# Patient Record
Sex: Male | Born: 1940 | Race: White | Hispanic: No | Marital: Married | Smoking: Former smoker
Health system: Southern US, Community
[De-identification: ages and names within clinical notes are randomized; demographics above are authoritative.]

## PROBLEM LIST (undated history)

## (undated) DIAGNOSIS — H9190 Unspecified hearing loss, unspecified ear: Secondary | ICD-10-CM

## (undated) DIAGNOSIS — R413 Other amnesia: Secondary | ICD-10-CM

## (undated) DIAGNOSIS — J449 Chronic obstructive pulmonary disease, unspecified: Secondary | ICD-10-CM

## (undated) DIAGNOSIS — I10 Essential (primary) hypertension: Secondary | ICD-10-CM

## (undated) DIAGNOSIS — C801 Malignant (primary) neoplasm, unspecified: Secondary | ICD-10-CM

## (undated) HISTORY — PX: HERNIA REPAIR: SHX51

## (undated) HISTORY — DX: Chronic obstructive pulmonary disease, unspecified: J44.9

## (undated) HISTORY — DX: Malignant (primary) neoplasm, unspecified: C80.1

## (undated) HISTORY — DX: Other amnesia: R41.3

## (undated) HISTORY — DX: Essential (primary) hypertension: I10

## (undated) HISTORY — PX: PROSTATE SURGERY: SHX751

## (undated) HISTORY — DX: Unspecified hearing loss, unspecified ear: H91.90

---

## 1999-02-02 ENCOUNTER — Ambulatory Visit (HOSPITAL_COMMUNITY): Admission: RE | Admit: 1999-02-02 | Discharge: 1999-02-02 | Payer: Self-pay | Admitting: *Deleted

## 2001-01-24 ENCOUNTER — Encounter (INDEPENDENT_AMBULATORY_CARE_PROVIDER_SITE_OTHER): Payer: Self-pay | Admitting: Specialist

## 2001-01-24 ENCOUNTER — Other Ambulatory Visit: Admission: RE | Admit: 2001-01-24 | Discharge: 2001-01-24 | Payer: Self-pay | Admitting: Urology

## 2001-03-26 ENCOUNTER — Encounter (INDEPENDENT_AMBULATORY_CARE_PROVIDER_SITE_OTHER): Payer: Self-pay

## 2001-03-26 ENCOUNTER — Inpatient Hospital Stay (HOSPITAL_COMMUNITY): Admission: RE | Admit: 2001-03-26 | Discharge: 2001-03-29 | Payer: Self-pay | Admitting: Urology

## 2002-04-12 ENCOUNTER — Encounter (INDEPENDENT_AMBULATORY_CARE_PROVIDER_SITE_OTHER): Payer: Self-pay | Admitting: Specialist

## 2002-04-12 ENCOUNTER — Ambulatory Visit (HOSPITAL_COMMUNITY): Admission: RE | Admit: 2002-04-12 | Discharge: 2002-04-12 | Payer: Self-pay | Admitting: *Deleted

## 2010-11-30 ENCOUNTER — Encounter
Admission: RE | Admit: 2010-11-30 | Discharge: 2010-11-30 | Payer: Self-pay | Source: Home / Self Care | Attending: Family Medicine | Admitting: Family Medicine

## 2011-03-25 NOTE — Op Note (Signed)
[facility]  Patient:    CASHTON, HOSLEY                  MRN: [id number] Proc. Date: 03/26/01 Adm. Date:  [id number] Attending:  Lauree Chandler CC:         Tammy R. Collins Scotland, M.D.   Operative Report  PREOPERATIVE DIAGNOSIS:  Prostatic carcinoma.  POSTOPERATIVE DIAGNOSIS:  Prostatic carcinoma.  PROCEDURE:  Radical retropubic prostatectomy and bilateral pelvic lymph node dissection.  SURGEON:  Maretta Bees. Vonita Moss, M.D.  ASSISTANT:  Verl Dicker, M.D.  ANESTHESIA:  General.  INDICATIONS:  This 70 year old gentleman was found to have a PSA of 5.8, and then a biopsy of the prostate showed a Gleason grade 6 carcinoma, and he opted for radical retropubic prostatectomy and was advised about the surgical risks of impotence, incontinence, rectal injury, or hemorrhage.  DESCRIPTION OF PROCEDURE:  The patient was brought to the operating room and placed in the lithotomy position.  The lower abdomen and external genitalia were prepped and draped in the usual fashion.  A lower midline incision was made in the rectus fascia and divided in the midline, and the bilateral pelvic lymph node spaces were dissected out.  The right obturator lymph node packet was dissected out with care to protect the iliac vessels and obturator nerve, and metal hemoclips and dark silk ties were utilized for hemostasis and lymphostasis.  In a like fashion, the left obturator lymph node packet was dissected out.  The endopelvic fascia was then taken down bilaterally.  The puboprostatic ligaments were divided.  Superficial dorsal vein was divided and ligated with 3-0 chromic catgut.  A Hohenfeltner clamp was placed around the dorsal vein complex and a heavy Vicryl tie placed around it.  Subsequently, the tie was a little too close to the apex of the prostate, so I cut through that and then secured the dorsal vein complex with figure-of-eight sutures of #2-0 Prolene.  The  anterior aspect of the urethra was divided just beyond the apex of the prostate and the Foley catheter brought up in the wound, and the posterior aspect of the prostate then divided.  The prostate came up nicely off the rectum.  The prostatic pedicles were controlled with metal hemoclips. The posterior aspect of the seminal vesicles were easily dissected out.  The anterior bladder neck was opened.  The ureteral orifices were noted to excrete indigo carmine stained urine.  The posterior bladder neck was divided in the plane between the posterior bladder neck and the prostate and seminal vesicles identified and dissected out.  The seminal vesicles and vas deferens were then divided and ligated with metal hemoclips.  The specimen was removed intact and had very good apical margin.  The bladder neck mucosa was approximated to the surrounding bladder neck with interrupted 4-0 chromic catgut.  The posterior bladder neck was closed in a tennis racquet fashion using running #2-0 chromic catgut.  Subsequently, the anastomosis between the bladder neck and urethra was completed with 2-0 Vicryl placed at 2, 5, 7, 10, and 12 oclock.  The bladder was drained with a 22 French 5 cc Foley and had #1 Prolene placed through the tip of the Foley catheter and brought through the anterior bladder wall and subsequently through the abdominal wound and sutured at the skin level to the button.  With 15 cc of water in the balloon, the anastomosis between the bladder neck and urethra were tied down and were watertight with irrigation and  antibiotic solution.  Stab wound to the left ______ incision was made and placed a large Blake drain for perivesical wound drainage.  The drain was held in place with a black silk and connected to bulb suction.  The wound was irrigated with triple antibiotic solution and the rectus fascia closed with a running #1 PDS.  The skin was closed with skin staples.  The wound was clean and  dressed with dry sterile gauze dressings.  Sponge, needle and instrument counts were correct.  The patient tolerated the procedure well and was taken to the recovery room in good condition. DD:  03/26/01 TD:  03/26/01 Job: [id number]

## 2011-03-25 NOTE — Discharge Summary (Signed)
[facility]  Patient:    Todd Cain, Todd Cain                  MRN: [id number] Adm. Date:  [id number] Disc. Date: [id number] Attending:  Lauree Chandler CC:         Tammy R. Collins Scotland, M.D.                           Discharge Summary  FINAL DIAGNOSES: 1. Prostatic carcinoma. 2. Hypertension. 3. Gastroesophageal reflux disease. 4. Emphysema. 5. Environmental allergies.  PROCEDURES:  Radical retropubic prostatectomy and bilateral pelvic lymph node dissection.  HISTORY OF PRESENT ILLNESS:  This 70 year old white male was referred to me with a PSA of 5.8.  It had elevated from a value of 1.6 a year ago.  Biopsy revealed a Gleason grade 6 carcinoma.  He was counseled about therapy and elected surgical removal.  Physical exam was noncontributory.  HOSPITAL COURSE:  After admission, he underwent a radical retropubic prostatectomy and bilateral pelvic lymph node dissection.  Postoperatively, he did quite nicely, had no problems with fever.  He had no excessive Blake drain output.  He ambulated well.  He started passing flatus and was started on a diet on Mar 28, 2001.  His Blake drain was removed on the day of discharge. His final pathology showed carcinoma confined to the prostate.  DISCHARGE MEDICATIONS:  He was sent home on his usual medications: 1. Toprol XL 100 mg q.d. 2. Doxazosin 1 mg q.d. 3. Prilosec 20 mg q.d. 4. Flonase sprays p.r.n. and his other pulmonary inhalers.  DISCHARGE INSTRUCTIONS:  He will go home on light activity and diet as tolerated.  He will go home with his Foley-connected leg bag.  He may shower tomorrow.  DISCHARGE FOLLOWUP:  He will return to the office next week for skin staple removal.  CONDITION AT DISCHARGE:  He was sent home in good condition. DD:  03/29/01 TD:  03/29/01 Job: [id number]

## 2011-03-25 NOTE — H&P (Signed)
[facility]  Patient:    Todd Cain, Todd Cain                  MRN: [id number] Adm. Date:  [id number] Attending:  Lauree Chandler CC:         Tammy R. Collins Scotland, M.D.   History and Physical  HISTORY:  This 70 year old white male was referred to me in March of this year because he had a PSA of 5.8.  A year ago, his PSA was 1.6.  He had some mild voiding symptoms.  He underwent a prostate ultrasound and biopsy which showed a 35 cc prostate and he had Gleason grade 6 carcinoma in the right lobe.  He was counseled about therapeutic measures and he elected for radical retropubic prostatectomy and bilateral pelvic lymph node dissection and he was cleared medically by Dr. Bayard Beaver. Spear.  PAST MEDICAL HISTORY:  His medical problems include well-controlled hypertension, some GERD and also some emphysema and nasal allergies.  MEDICATIONS: 1. Toprol-XL 100 mg a day. 2. Doxazosin 1 mg per day. 3. Prilosec 20 mg daily. 4. Flonase sprays p.r.n. 5. Advair Diskus b.i.d.  ALLERGIES:  None to medications.  PAST SURGICAL HISTORY:  Previous surgeries include an umbilical hernia repair and sinus surgery.  SOCIAL HISTORY:  He does not smoke or drink alcohol.  FAMILY HISTORY:  Noncontributory.  REVIEW OF SYSTEMS:  Review of systems is signed off on the health history form.  PHYSICAL EXAMINATION:  VITAL SIGNS:  As recorded.  GENERAL:  He is alert and oriented.  SKIN:  Warm and dry.  NECK:  Supple.  CHEST:  Increased AP diameter of the chest but clear.  HEART:  Heart tones regular.  ABDOMEN:  Soft and nontender.  Umbilical hernia incisional scar.  GU:  External genitalia unremarkable.  RECTAL:  Prostate feels benign.  IMPRESSION: 1. Prostatic carcinoma. 2. Hypertension. 3. Gastroesophageal reflux disease. 4. Emphysema. 5. Environmental allergies.  PLAN:  Radical retropubic prostatectomy and bilateral pelvic lymph node dissection. DD:   03/26/01 TD:  03/26/01 Job: [id number]

## 2011-05-10 ENCOUNTER — Ambulatory Visit (HOSPITAL_BASED_OUTPATIENT_CLINIC_OR_DEPARTMENT_OTHER)
Admission: RE | Admit: 2011-05-10 | Discharge: 2011-05-10 | Disposition: A | Discharge status: Home / Self Care | Payer: Federal, State, Local not specified - PPO | Source: Ambulatory Visit | Attending: Urology | Admitting: Urology

## 2011-05-10 DIAGNOSIS — Z9079 Acquired absence of other genital organ(s): Secondary | ICD-10-CM | POA: Insufficient documentation

## 2011-05-10 DIAGNOSIS — Z01812 Encounter for preprocedural laboratory examination: Secondary | ICD-10-CM | POA: Insufficient documentation

## 2011-05-10 DIAGNOSIS — K219 Gastro-esophageal reflux disease without esophagitis: Secondary | ICD-10-CM | POA: Insufficient documentation

## 2011-05-10 DIAGNOSIS — N3946 Mixed incontinence: Secondary | ICD-10-CM | POA: Insufficient documentation

## 2011-05-10 DIAGNOSIS — Z79899 Other long term (current) drug therapy: Secondary | ICD-10-CM | POA: Insufficient documentation

## 2011-05-10 DIAGNOSIS — N320 Bladder-neck obstruction: Secondary | ICD-10-CM | POA: Insufficient documentation

## 2011-05-10 DIAGNOSIS — I10 Essential (primary) hypertension: Secondary | ICD-10-CM | POA: Insufficient documentation

## 2011-05-10 LAB — POCT I-STAT, CHEM 8
Creatinine, Ser: 1.4 mg/dL — ABNORMAL HIGH (ref 0.50–1.35)
Glucose, Bld: 118 mg/dL — ABNORMAL HIGH (ref 70–99)
Hemoglobin: 16.3 g/dL (ref 13.0–17.0)
TCO2: 22 mmol/L (ref 0–100)

## 2011-05-24 NOTE — Op Note (Signed)
  NAMELevaughn, Todd Cain NO.:  [id number]  MEDICAL RECORD NO.:  [id number]  LOCATION:                                 FACILITY:  PHYSICIAN:  Martina Sinner, MD      DATE OF BIRTH:  DATE OF PROCEDURE:  05/10/2011 DATE OF DISCHARGE:                              OPERATIVE REPORT   PREOPERATIVE DIAGNOSIS:  Bladder neck contracture.  POSTOPERATIVE DIAGNOSIS:  Bladder neck contracture.  SURGERY:  Cystoscopy, balloon dilation.  The patient has mixed incontinence and the above diagnosis.  He consented to balloon dilation.  The patient was prepped and draped in usual fashion.  A 21-French scope was utilized.  Penile bulbar urethra were normal.  A 6-8 French bladder neck contracture looked short.  Under cystoscopic and fluoroscopic guidance, I passed a sensor wire through the bladder neck contracture curling in the bladder.  Under fluoroscopic guidance, I placed the balloon dilation catheter well lubricated with a marker just into the bladder.  I balloon dilated for approximately 3 to 4 minutes at 18 atmospheres of pressure.  There was no wasting of the balloon.  I deflated the balloon dilation catheter and removed it.  I scoped along the wire with a 17-French scope.  The bladder neck was wide open approximately 18 to 19 [place].  Bladder mucosa and trigone were normal. No stitch, foreign body or carcinoma.  There was no bleeding.  Catheter was not inserted.  Bladder was emptied.  The patient was taken to recovery room.  The patient now will undergo urodynamics and will proceed accordingly.          ______________________________ Martina Sinner, MD     SAM/MEDQ  D:  05/10/2011  T:  05/10/2011  Job:  [id number]  Electronically Signed by Alfredo Martinez MD on 05/24/2011 08:14:00 AM

## 2011-11-30 ENCOUNTER — Encounter: Payer: Federal, State, Local not specified - PPO | Attending: Family Medicine | Admitting: *Deleted

## 2011-11-30 ENCOUNTER — Encounter: Payer: Self-pay | Admitting: *Deleted

## 2011-11-30 DIAGNOSIS — Z713 Dietary counseling and surveillance: Secondary | ICD-10-CM | POA: Insufficient documentation

## 2011-11-30 DIAGNOSIS — E119 Type 2 diabetes mellitus without complications: Secondary | ICD-10-CM | POA: Insufficient documentation

## 2011-11-30 NOTE — Progress Notes (Signed)
  Medical Nutrition Therapy:  Appt start time: 1100 end time:  1200.   Assessment:  Primary concerns today: Patient here with his wife who appears supportive. They are both retired and live fairly quiet lives. She has studied nutrition and been taught carb counting in the past.   MEDICATIONS: see list. No diabetes medications at this time   DIETARY INTAKE:  Usual eating pattern includes 3 meals and 1-2 snacks per day.  Everyday foods include good variety of all food groups.  Avoided foods include fried or fatty foods, regular soda.    24-hr recall:  B ( AM): large bowl sweetened cereal, skim milk, coffee w/ Equal and powdered skim milk Snk ( AM): none  L ( PM): skip occasionally OR Nutrigrain bar etc OR sandwich - ham & cheese on whole grain, occasionally chips with flaxseed, iced tea w/ Splenda Snk ( PM): Nutri grain bar or pudding D ( PM): lean meat, starch, (sweet potato) vegetables, occasional salad, OR Healthy Choice dinner, iced tea Snk ( PM): pudding, jello, special cake if made, ice cream Beverages: coffee, iced tea with Splenda,   Usual physical activity: limited, sits at computer a lot  Estimated energy needs: 2000 calories 225 g carbohydrates 150 g protein 56 g fat  Progress Towards Goal(s):  In progress.   Nutritional Diagnosis:  NI-5.8.4 Inconsistent carbohydrate intake As related to pre-diabetes.  As evidenced by elevated BG during GTT.    Intervention:  Nutrition counseling and diabetes information provided. Discussed basic physiology of diabetes in relation to pre-diabetes. Also explained the advantage of a consistent carb intake, taught carb counting and how to read food labels.  Plan: Aim for 4 Carb choices (60 grams) per meal and 2 per snack if hungry Read Food Labels for Total Carbohydrate of foods Continue with current lunch and supper choices as they are moderate in carb content Consider either smaller portion of sweetened cereal or normal portion of  unsweetened cereal for breakfast  Handouts given during visit include:  Living Well with Diabetes  Carb Counting and Food Label Handout  Monitoring/Evaluation:  Dietary intake, exercise, Food Labels, and body weight in 2 week(s).

## 2011-11-30 NOTE — Patient Instructions (Signed)
Plan: Aim for 4 Carb choices (60 grams) per meal and 2 per snack if hungry Read Food Labels for Total Carbohydrate of foods Continue with current lunch and supper choices as they are moderate in carb content Consider either smaller portion of sweetened cereal or normal portion of unsweetened cereal for breakfast

## 2011-12-26 ENCOUNTER — Encounter: Payer: Self-pay | Admitting: *Deleted

## 2011-12-26 ENCOUNTER — Encounter: Payer: Federal, State, Local not specified - PPO | Attending: Family Medicine | Admitting: *Deleted

## 2011-12-26 DIAGNOSIS — E119 Type 2 diabetes mellitus without complications: Secondary | ICD-10-CM | POA: Insufficient documentation

## 2011-12-26 DIAGNOSIS — Z713 Dietary counseling and surveillance: Secondary | ICD-10-CM | POA: Insufficient documentation

## 2011-12-26 NOTE — Patient Instructions (Signed)
Plan: Start Lower Sodium Diet to help kidney function improve Continue with Diabetes Meal Plan with 2-3 Carb choices per meal for continued weight loss Continue with exercising 10 minutes per day and increase as tolerated.  Walking or stationary bike.

## 2011-12-26 NOTE — Progress Notes (Signed)
  Medical Nutrition Therapy:  Appt start time: 1100 end time:  1200.   Assessment:  Primary concerns today: Patient here by himself for follow up visit. He has lost 4 pounds in the past 4 weeks. He states he recently saw a specialist for his kidneys and has been cautioned that he needs to lose weight and control his blood pressure better. He is very motivated to protect his kidneys as soon as possible.  MEDICATIONS: see list. No diabetes medications at this time   DIETARY INTAKE:  Usual eating pattern includes 3 meals and 1-2 snacks per day.  Everyday foods include good variety of all food groups.  Avoided foods include fried or fatty foods, regular soda.    24-hr recall:  B ( AM): large bowl un-sweetened cereal, skim milk, coffee w/ Equal and powdered skim milk Snk ( AM): none  L ( PM): skip occasionally OR Nutrigrain bar etc OR sandwich - ham & cheese on whole grain, occasionally chips with flaxseed, iced tea w/ Splenda Snk ( PM): Nutri grain bar or pudding D ( PM): lean meat, starch, (sweet potato) vegetables, occasional salad, OR Healthy Choice dinner, iced tea Snk ( PM): pudding, jello, special cake if made, ice cream Beverages: coffee, iced tea with Splenda,   Usual physical activity:has increased to walking about 10 minutes daily and plans to start riding the stationary bike he has at home. Estimated energy needs: 1600 calories 180 g carbohydrates 120 g protein 44 g fat  Progress Towards Goal(s):  In progress.   Nutritional Diagnosis:  NI-5.8.4 Inconsistent carbohydrate intake As related to pre-diabetes.  As evidenced by elevated BG during GTT.    Intervention: Spent most of this visit discussing ways to lower his sodium intake. Reviewed foods he typically eats in terms of Carb and Sodium content. He has adjusted his breakfast meal appropriately to unsweetened cereals and continues to eat appropriately for lunch and supper.  Plan: Start Lower Sodium Diet to help kidney  function improve Continue with Diabetes Meal Plan with 2-3 Carb choices per meal for continued weight loss Continue with exercising 10 minutes per day and increase as tolerated.  Walking or stationary bike.  Handouts given during visit include:  Sodium content of foods by ADA  Monitoring/Evaluation:  Dietary intake, exercise, Food Labels, and body weight in 2 months.

## 2013-01-28 ENCOUNTER — Emergency Department (HOSPITAL_COMMUNITY)
Admission: EM | Admit: 2013-01-28 | Discharge: 2013-01-28 | Disposition: A | Discharge status: Home / Self Care | Payer: Federal, State, Local not specified - PPO | Attending: Emergency Medicine | Admitting: Emergency Medicine

## 2013-01-28 ENCOUNTER — Emergency Department (HOSPITAL_COMMUNITY): Payer: Federal, State, Local not specified - PPO

## 2013-01-28 ENCOUNTER — Encounter (HOSPITAL_COMMUNITY): Payer: Self-pay | Admitting: Emergency Medicine

## 2013-01-28 DIAGNOSIS — J4489 Other specified chronic obstructive pulmonary disease: Secondary | ICD-10-CM | POA: Insufficient documentation

## 2013-01-28 DIAGNOSIS — R197 Diarrhea, unspecified: Secondary | ICD-10-CM | POA: Insufficient documentation

## 2013-01-28 DIAGNOSIS — I10 Essential (primary) hypertension: Secondary | ICD-10-CM | POA: Insufficient documentation

## 2013-01-28 DIAGNOSIS — M545 Low back pain: Secondary | ICD-10-CM

## 2013-01-28 DIAGNOSIS — K644 Residual hemorrhoidal skin tags: Secondary | ICD-10-CM | POA: Insufficient documentation

## 2013-01-28 DIAGNOSIS — Z859 Personal history of malignant neoplasm, unspecified: Secondary | ICD-10-CM | POA: Insufficient documentation

## 2013-01-28 DIAGNOSIS — E669 Obesity, unspecified: Secondary | ICD-10-CM | POA: Insufficient documentation

## 2013-01-28 DIAGNOSIS — Z79899 Other long term (current) drug therapy: Secondary | ICD-10-CM | POA: Insufficient documentation

## 2013-01-28 DIAGNOSIS — K921 Melena: Secondary | ICD-10-CM | POA: Insufficient documentation

## 2013-01-28 DIAGNOSIS — J449 Chronic obstructive pulmonary disease, unspecified: Secondary | ICD-10-CM | POA: Insufficient documentation

## 2013-01-28 DIAGNOSIS — Z7982 Long term (current) use of aspirin: Secondary | ICD-10-CM | POA: Insufficient documentation

## 2013-01-28 LAB — URINALYSIS, ROUTINE W REFLEX MICROSCOPIC
Bilirubin Urine: NEGATIVE
Hgb urine dipstick: NEGATIVE
Protein, ur: NEGATIVE mg/dL
Specific Gravity, Urine: 1.013 (ref 1.005–1.030)
Urobilinogen, UA: 0.2 mg/dL (ref 0.0–1.0)

## 2013-01-28 LAB — CBC WITH DIFFERENTIAL/PLATELET
Eosinophils Absolute: 0.6 10*3/uL (ref 0.0–0.7)
Eosinophils Relative: 5 % (ref 0–5)
Lymphs Abs: 3.3 10*3/uL (ref 0.7–4.0)
MCH: 28.7 pg (ref 26.0–34.0)
MCV: 83.5 fL (ref 78.0–100.0)
Monocytes Absolute: 1 10*3/uL (ref 0.1–1.0)
Platelets: 283 10*3/uL (ref 150–400)
RBC: 5.27 MIL/uL (ref 4.22–5.81)

## 2013-01-28 LAB — BASIC METABOLIC PANEL
BUN: 17 mg/dL (ref 6–23)
Calcium: 9.8 mg/dL (ref 8.4–10.5)
Creatinine, Ser: 1.62 mg/dL — ABNORMAL HIGH (ref 0.50–1.35)
GFR calc non Af Amer: 40 mL/min — ABNORMAL LOW (ref >90)
Glucose, Bld: 93 mg/dL (ref 70–99)
Sodium: 138 mEq/L (ref 135–145)

## 2013-01-28 MED ORDER — ONDANSETRON 4 MG PO TBDP
8.0000 mg | ORAL_TABLET | Freq: Once | ORAL | Status: AC
  Administered 2013-01-28: 8 mg via ORAL
  Filled 2013-01-28: qty 1

## 2013-01-28 MED ORDER — CYCLOBENZAPRINE HCL 10 MG PO TABS: 10.0000 mg | ORAL_TABLET | Freq: Two times a day (BID) | ORAL | Status: DC | PRN

## 2013-01-28 MED ORDER — OXYCODONE-ACETAMINOPHEN 5-325 MG PO TABS
2.0000 | ORAL_TABLET | Freq: Once | ORAL | Status: AC
  Administered 2013-01-28: 2 via ORAL
  Filled 2013-01-28: qty 2

## 2013-01-28 MED ORDER — OXYCODONE-ACETAMINOPHEN 5-325 MG PO TABS: 1.0000 | ORAL_TABLET | ORAL | Status: DC | PRN

## 2013-01-28 NOTE — ED Notes (Signed)
Wife states pt falls asleep in computer chair

## 2013-01-28 NOTE — ED Provider Notes (Signed)
History     CSN: [id number]  Arrival date & time 01/28/13  1449   First MD Initiated Contact with Patient 01/28/13 1817      Chief Complaint  Patient presents with  . Back Pain  . GI Bleeding    (Consider location/radiation/quality/duration/timing/severity/associated sxs/prior treatment) HPI Comments: Patient is a 72 year old male with a past medical history of COPD and hypertension who presents for left lower back pain x 2.5 weeks. Patient states the pain is intermittently sharp in sensation and nonradiating. Patient states the pain is worse with movement and walking and mildly improved when lying on his back or right side. Patient has tried heat packs on his back with mild relief and ibuprofen without relief. Patient denies fever, vision changes, chest pain, shortness of breath, numbness or tingling in his extremities, weakness, saddle anesthesia, and bowel or bladder incontinence. Patient presents to emergency Department as he was having a bowel movement yesterday and noticed bright red blood on the toilet tissue after lying and himself. Patient called his primary care provider, Herb Grays, who advised him to be seen in the emergency department given his left lower back pain and bright red blood per rectum. Patient states the bowel movement was watery in consistency and he admits to noticing a small amount of blood in the toilet bowl not mixed with his stool.the patient has not had a bowel movement today but denies bleeding from his rectum since this episode. He further denies nausea, vomiting, abdominal pain, pain with defecation or rectal pain, hematuria, and dysuria. Patient has a history of TURP in 2002. Patient admits to being followed by a nephrologist, Dr. Kathrene Bongo. Last colonoscopy was 5 years ago.  The history is provided by the patient. No language interpreter was used.    Past Medical History  Diagnosis Date  . Cancer   . COPD (chronic obstructive pulmonary disease)   .  Hypertension     Past Surgical History  Procedure Laterality Date  . Hernia repair    . Prostate surgery      No family history on file.  History  Substance Use Topics  . Smoking status: Former Smoker    Quit date: 11/29/2010  . Smokeless tobacco: Never Used  . Alcohol Use: No      Review of Systems  Constitutional: Negative for fever and chills.  Eyes: Negative for visual disturbance.  Respiratory: Negative for shortness of breath.   Cardiovascular: Negative for chest pain and leg swelling.  Gastrointestinal: Positive for blood in stool. Negative for nausea, vomiting, abdominal pain and diarrhea.  Genitourinary: Negative for dysuria and hematuria.  Musculoskeletal: Positive for back pain.  Neurological: Negative for syncope, weakness and numbness.  All other systems reviewed and are negative.    Allergies  Review of patient's allergies indicates no known allergies.  Home Medications   Current Outpatient Rx  Name  Route  Sig  Dispense  Refill  . amLODipine-benazepril (LOTREL) 5-10 MG per capsule   Oral   Take 1 capsule by mouth daily.         Marland Kitchen aspirin 81 MG tablet   Oral   Take 81 mg by mouth daily.          Marland Kitchen buPROPion (WELLBUTRIN XL) 300 MG 24 hr tablet   Oral   Take 300 mg by mouth daily.         . carvedilol (COREG) 12.5 MG tablet   Oral   Take 12.5 mg by mouth 2 (two) times daily  with a meal.         . citalopram (CELEXA) 20 MG tablet   Oral   Take 20 mg by mouth daily.         . fish oil-omega-3 fatty acids 1000 MG capsule   Oral   Take 2 g by mouth daily.         Marland Kitchen ibuprofen (ADVIL,MOTRIN) 200 MG tablet   Oral   Take 400 mg by mouth every 6 (six) hours as needed for pain.         Marland Kitchen omeprazole (PRILOSEC) 20 MG capsule   Oral   Take 20 mg by mouth daily.         . pravastatin (PRAVACHOL) 40 MG tablet   Oral   Take 40 mg by mouth daily.         . cyclobenzaprine (FLEXERIL) 10 MG tablet   Oral   Take 1 tablet (10 mg  total) by mouth 2 (two) times daily as needed for muscle spasms.   10 tablet   0   . oxyCODONE-acetaminophen (PERCOCET/ROXICET) 5-325 MG per tablet   Oral   Take 1 tablet by mouth every 4 (four) hours as needed for pain.   10 tablet   0     BP 136/80  Pulse 92  Temp(Src) 98.7 F (37.1 C) (Rectal)  Resp 16  SpO2 97%  Physical Exam  Nursing note and vitals reviewed. Constitutional: He is oriented to person, place, and time. No distress.  Obese male who is resting comfortably, well and nontoxic appearing, and in no acute distress.  HENT:  Head: Normocephalic and atraumatic.  Mouth/Throat: Oropharynx is clear and moist. No oropharyngeal exudate.  Eyes: Conjunctivae are normal. Pupils are equal, round, and reactive to light. No scleral icterus.  Neck: Normal range of motion. Neck supple.  Cardiovascular: Normal rate, regular rhythm, normal heart sounds and intact distal pulses.   Distal radial, dorsalis pedis, and posterior tibial pulses 2+ bilaterally.  Pulmonary/Chest: Effort normal and breath sounds normal. No respiratory distress. He has no wheezes. He has no rales.  Abdominal: Soft. Bowel sounds are normal. He exhibits no distension. There is no tenderness. There is no rebound and no guarding.  Genitourinary: Rectal exam shows external hemorrhoid. Rectal exam shows no internal hemorrhoid, no fissure, no tenderness and anal tone normal.  Patient exhibits normal rectal tone on rectal exam. Exam significant for a small external hemorrhoid that is nonthrombosed. No internal hemorrhoids or anal fissures appreciated. Stool is brown in color with no evidence of blood.  Musculoskeletal:       Lumbar back: He exhibits tenderness. He exhibits no bony tenderness, no swelling, no deformity, no laceration and no spasm.       Back:  Patient has tenderness on palpation of his left lumbar paraspinal muscles. No tenderness on palpation of his lumbar vertebrae. No step-offs or bony deformities  appreciated. No ecchymosis or abrasions. Patient has negative straight leg raising and cross straight leg raise. Deep tendon reflexes normal and symmetric in the patient's b/l lower extremities. Normal range of motion of patient's bilateral hips, knees, and ankles with no tenderness on palpation. No sensory or motor deficits appreciated.  Lymphadenopathy:    He has no cervical adenopathy.  Neurological: He is alert and oriented to person, place, and time.  Skin: Skin is warm and dry. He is not diaphoretic.  Psychiatric: He has a normal mood and affect. His behavior is normal.    ED Course  Procedures (  including critical care time)  Labs Reviewed  CBC WITH DIFFERENTIAL - Abnormal; Notable for the following:    WBC 11.2 (*)    All other components within normal limits  BASIC METABOLIC PANEL - Abnormal; Notable for the following:    Creatinine, Ser 1.62 (*)    GFR calc non Af Amer 40 (*)    GFR calc Af Amer 47 (*)    All other components within normal limits  OCCULT BLOOD, POC DEVICE - Abnormal; Notable for the following:    Fecal Occult Bld POSITIVE (*)    All other components within normal limits  URINALYSIS, ROUTINE W REFLEX MICROSCOPIC  OCCULT BLOOD X 1 CARD TO LAB, STOOL   Dg Lumbar Spine Complete  01/28/2013  *RADIOLOGY REPORT*  Clinical Data: Low back pain.  Left-sided back pain for 2 weeks.  LUMBAR SPINE - COMPLETE 4+ VIEW  Comparison: None.  Findings: There is a dextroconvex lumbar curvature with the apex at L3.  Asymmetric left-sided disc space collapse is present at L3-L4. Lower lumbar facet arthrosis.  There is complete collapse of the disc L4-L5 with apparent ankylosis across the disc space.  Lower lumbar endplate spurring is present encroaching on the neural foramina.  There is no fracture or acute osseous abnormality. Vertebral body height is preserved.  Aortoiliac atherosclerosis. Surgical clips in the anatomic pelvis.  The visualized pelvic rings appear intact.  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  No acute osseous abnormality.   Original Report Authenticated By: Andreas Newport, M.D.     1. Blood in stool   2. Low back pain     MDM  Patient presents for left sided low back pain x 2-1/2 weeks as well as one instance of bright red blood per rectum yesterday upon wiping himself after an episode watery diarrhea. On exam the patient exhibits tenderness on palpation of his left lumbar paraspinal muscles with a negative straight leg raising cross straight leg raise. Given patient's episode of rectal bleeding workup in the emergency department with basic labs, urinalysis, and hemoccult.   Patient's labs significant for positive hemoccult and creatinine elevated to 1.62. Xray of the lumbar spine also significant for moderate to severe chronic lumbar spondylosis without any evidence of acute fracture, dislocation, or bony abnormality. Patient's Hgb/Hct stable, c/w prior work ups, and UA unremarkable; he is well and nontoxic appearing and in NAD. Based on results and physical exam believe patient's complaints are unrelated. Will d/c with orthopedic and PCP follow up with both flexeril and percocet to take as needed for his back pain. Have advised the patient to also f/u with his nephrologist regarding his kidney function. Patient states comfort and understanding with this d/c plan with no unaddressed concerns. Patient work up and management discussed with Dr. Rubin Payor who is in agreement.  Filed Vitals:   01/28/13 2000 01/28/13 2030 01/28/13 2127 01/28/13 2130  BP: 103/68 119/66 128/66 136/80  Pulse: 88 88 92 92  Temp:   98.7 F (37.1 C)   TempSrc:   Rectal   Resp:   16   SpO2: 96% 94% 96% 97%           Antony Madura, PA-C 01/30/13 1612

## 2013-01-28 NOTE — ED Notes (Signed)
Hurts  On left back  X 2 1/2 weeks to 3 weeks Had blood in bm yesterday when he wiped himself,had diarrhea yesterday  No injury to back

## 2013-01-30 NOTE — ED Provider Notes (Signed)
Medical screening examination/treatment/procedure(s) were performed by non-physician practitioner and as supervising physician I was immediately available for consultation/collaboration.  Marnesha Gagen R. Hadlei Stitt, MD 01/30/13 2353 

## 2016-01-19 ENCOUNTER — Other Ambulatory Visit: Payer: Self-pay | Admitting: Family Medicine

## 2016-01-19 DIAGNOSIS — Z87891 Personal history of nicotine dependence: Secondary | ICD-10-CM

## 2016-01-27 ENCOUNTER — Ambulatory Visit
Admission: RE | Admit: 2016-01-27 | Discharge: 2016-01-27 | Disposition: A | Discharge status: Home / Self Care | Payer: Federal, State, Local not specified - PPO | Source: Ambulatory Visit | Attending: Family Medicine | Admitting: Family Medicine

## 2016-01-27 DIAGNOSIS — Z87891 Personal history of nicotine dependence: Secondary | ICD-10-CM

## 2017-04-20 ENCOUNTER — Encounter (INDEPENDENT_AMBULATORY_CARE_PROVIDER_SITE_OTHER): Payer: Self-pay

## 2017-04-20 ENCOUNTER — Encounter: Payer: Self-pay | Admitting: Neurology

## 2017-04-20 ENCOUNTER — Ambulatory Visit (INDEPENDENT_AMBULATORY_CARE_PROVIDER_SITE_OTHER): Payer: Federal, State, Local not specified - PPO | Admitting: Neurology

## 2017-04-20 VITALS — BP 105/63 | HR 72 | Ht 67.5 in | Wt 211.0 lb

## 2017-04-20 DIAGNOSIS — R413 Other amnesia: Secondary | ICD-10-CM

## 2017-04-20 DIAGNOSIS — H903 Sensorineural hearing loss, bilateral: Secondary | ICD-10-CM

## 2017-04-20 DIAGNOSIS — H9190 Unspecified hearing loss, unspecified ear: Secondary | ICD-10-CM | POA: Insufficient documentation

## 2017-04-20 DIAGNOSIS — E538 Deficiency of other specified B group vitamins: Secondary | ICD-10-CM

## 2017-04-20 HISTORY — DX: Other amnesia: R41.3

## 2017-04-20 HISTORY — DX: Unspecified hearing loss, unspecified ear: H91.90

## 2017-04-20 NOTE — Progress Notes (Signed)
Reason for visit: Memory disturbance  Referring physician: Dr. Orlene Erm Todd Cain is a 76 y.o. male  History of present illness:  Todd Cain is a 76 year old right-handed white male with a history of a memory disturbance that began within the last year. The patient has become somewhat forgetful, he has had troubles with directions with driving. He has gotten lost going to places that he should know how to get to. The patient is very hard of hearing. The patient otherwise is able to keep up with his finances, he manages his own medications and appointments. The patient is good at taking notes, he is very organized. He occasionally may misplace things about the house. He has noted some slight imbalance, he denies any falls. He denies weakness of the extremities or numbness of the extremities. He does have a history of some urinary incontinence following surgery for prostate cancer. Occasionally he may have a left occipital headache. He denies any family history of memory problems. He is sent to this office for further evaluation. He recently was placed on Aricept, so far he is tolerating the 5 mg Aricept dose over the last 2 weeks.   Past Medical History:  Diagnosis Date  . Cancer ([place])   . COPD (chronic obstructive pulmonary disease) ([place])   . Hypertension     Past Surgical History:  Procedure Laterality Date  . HERNIA REPAIR    . PROSTATE SURGERY      History reviewed. No pertinent family history.  Social history:  reports that he quit smoking about 6 years ago. He has never used smokeless tobacco. He reports that he does not drink alcohol or use drugs.  Medications:  Prior to Admission medications   Medication Sig Start Date End Date Taking? Authorizing Provider  amLODipine-benazepril (LOTREL) 5-10 MG per capsule Take 1 capsule by mouth daily.   Yes [provider]  aspirin 81 MG tablet Take 81 mg by mouth daily.    Yes [provider]  benazepril  (LOTENSIN) 10 MG tablet Take 10 mg by mouth daily. 02/23/17  Yes [provider]  buPROPion (WELLBUTRIN XL) 300 MG 24 hr tablet Take 300 mg by mouth daily.   Yes [provider]  carvedilol (COREG) 12.5 MG tablet Take 12.5 mg by mouth 2 (two) times daily with a meal.   Yes [provider]  COMBIGAN 0.2-0.5 % ophthalmic solution INSTILL 1 DROP INTO BOTH EYES EVERY 12 HOURS 03/29/17  Yes [provider]  donepezil (ARICEPT) 5 MG tablet Take 5 mg by mouth at bedtime. 03/27/17  Yes [provider]  dorzolamide (TRUSOPT) 2 % ophthalmic solution INSTILL 1 DROP INTO RIGHT EYE 2 TIMES EVERY DAY 04/03/17  Yes [provider]  fish oil-omega-3 fatty acids 1000 MG capsule Take 2 g by mouth daily.   Yes [provider]  ibuprofen (ADVIL,MOTRIN) 200 MG tablet Take 400 mg by mouth every 6 (six) hours as needed for pain.   Yes [provider]  latanoprost (XALATAN) 0.005 % ophthalmic solution INSTILL 1 DROP BY OPHTHALMIC ROUTE EVERY DAY INTO BOTH EYES IN THE EVENING 04/17/17  Yes [provider]  omeprazole (PRILOSEC) 20 MG capsule Take 20 mg by mouth daily.   Yes [provider]  oxyCODONE-acetaminophen (PERCOCET/ROXICET) 5-325 MG per tablet Take 1 tablet by mouth every 4 (four) hours as needed for pain. 01/28/13  Yes Antonietta Breach, PA-C  pravastatin (PRAVACHOL) 40 MG tablet Take 40 mg by mouth daily.  Yes [provider]     No Known Allergies  ROS:  Out of a complete 14 system review of symptoms, the patient complains only of the following symptoms, and all other reviewed systems are negative.  Weight gain Hearing loss, ringing in the ears Moles Urination problems, incontinence Visual disturbance, history of glaucoma Runny nose Memory loss Not enough sleep, sleepiness   Blood pressure 105/63, pulse 72, height 5' 7.5" (1.715 m), weight 211 lb (95.7 kg).  Physical Exam  General: The patient is alert and  cooperative at the time of the examination.  Eyes: Pupils are equal, round, and reactive to light. Discs are flat bilaterally.  Neck: The neck is supple, no carotid bruits are noted.  Respiratory: The respiratory examination is clear.  Cardiovascular: The cardiovascular examination reveals a regular rate and rhythm, no obvious murmurs or rubs are noted.  Skin: Extremities are without significant edema.  Neurologic Exam  Mental status: The patient is alert and oriented x 3 at the time of the examination. The patient has apparent normal recent and remote memory, with an apparently normal attention span and concentration ability.  Cranial nerves: Facial symmetry is present. There is good sensation of the face to pinprick and soft touch bilaterally. The strength of the facial muscles and the muscles to head turning and shoulder shrug are normal bilaterally. Speech is well enunciated, no aphasia or dysarthria is noted. Extraocular movements are full. Visual fields are full. The tongue is midline, and the patient has symmetric elevation of the soft palate.The patient is hard of hearing, he has a hearing aid on the right.  Motor: The motor testing reveals 5 over 5 strength of all 4 extremities. Good symmetric motor tone is noted throughout.  Sensory: Sensory testing is intact to pinprick, soft touch, vibration sensation, and position sense on all 4 extremities. No evidence of extinction is noted.  Coordination: Cerebellar testing reveals good finger-nose-finger and heel-to-shin bilaterally.  Gait and station: Gait is normal. Tandem gait is slightly unsteady. Romberg is negative. No drift is seen.  Reflexes: Deep tendon reflexes are symmetric and normal bilaterally. Toes are downgoing bilaterally.   Assessment/Plan:  1. Progressive memory disturbance   The patient has already started Aricept, they are to contact me in 2 weeks if he continues to do well on the medication and we will convert  him to the 10 mg tablet of Aricept. We will check blood work today, MRI of the brain will be done. He will follow-up in about 6 months.   Jill Alexanders MD 04/20/2017 3:11 PM  [facility] [address] [place], [place] [address]  Phone [phone] Fax [phone]

## 2017-04-20 NOTE — Patient Instructions (Signed)
   We will check blood work today and then get MRI evaluation of the brain.

## 2017-04-21 ENCOUNTER — Telehealth: Payer: Self-pay | Admitting: *Deleted

## 2017-04-21 LAB — VITAMIN B12: Vitamin B-12: 786 pg/mL (ref 232–1245)

## 2017-04-21 LAB — RPR: RPR Ser Ql: NONREACTIVE

## 2017-04-21 LAB — SEDIMENTATION RATE: SED RATE: 2 mm/h (ref 0–30)

## 2017-04-21 NOTE — Telephone Encounter (Signed)
Called and LVM for pt about unremarkable labs per CW,MD note. Gave [facility] phone number if has further questions or concerns.

## 2017-04-21 NOTE — Telephone Encounter (Signed)
-----   Message from Kathrynn Ducking, MD sent at 04/21/2017  7:22 AM EDT -----   The blood work results are unremarkable. Please call the patient.  ----- Message ----- From: Lavone Neri Lab Results In Sent: 04/21/2017   5:40 AM To: Kathrynn Ducking, MD

## 2017-05-01 ENCOUNTER — Telehealth: Payer: Self-pay | Admitting: Neurology

## 2017-05-01 MED ORDER — DONEPEZIL HCL 10 MG PO TABS: 10.0000 mg | ORAL_TABLET | Freq: Every day | ORAL | 3 refills | Status: AC

## 2017-05-01 NOTE — Telephone Encounter (Signed)
Patient's wife calling to get a new Rx for Aricept 10mg  as discussed at last visit called to [facility] on [address].

## 2017-05-01 NOTE — Telephone Encounter (Signed)
E-scribed new rx for aricept 10mg  tablet per CW,MD last OV note to requested pharmacy.

## 2017-05-01 NOTE — Addendum Note (Signed)
Addended by: Hope Pigeon on: 05/01/2017 01:45 PM   Modules accepted: Orders

## 2017-05-03 ENCOUNTER — Ambulatory Visit
Admission: RE | Admit: 2017-05-03 | Discharge: 2017-05-03 | Disposition: A | Discharge status: Home / Self Care | Payer: Federal, State, Local not specified - PPO | Source: Ambulatory Visit | Attending: Neurology | Admitting: Neurology

## 2017-05-03 DIAGNOSIS — R413 Other amnesia: Secondary | ICD-10-CM

## 2017-05-06 ENCOUNTER — Telehealth: Payer: Self-pay | Admitting: Neurology

## 2017-05-06 NOTE — Telephone Encounter (Signed)
I called the patient, talk with the wife. The study shows cortical atrophy, the patient has a moderate level of small vessel ischemic changes. He is on low-dose aspirin, and he is on medications for blood pressure.  He will remain on Aricept, follow-up in office in December 2018.    MRI brain 05/04/17:  IMPRESSION:  Abnormal MRI brain (without) demonstrating: 1. 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  2. Moderate chronic small vessel ischemic disease. 3. No acute findings.

## 2017-10-23 ENCOUNTER — Ambulatory Visit: Payer: Federal, State, Local not specified - PPO | Admitting: Adult Health

## 2017-10-24 ENCOUNTER — Encounter: Payer: Self-pay | Admitting: Adult Health

## 2017-10-24 ENCOUNTER — Ambulatory Visit: Payer: Federal, State, Local not specified - PPO | Admitting: Adult Health

## 2017-10-24 ENCOUNTER — Encounter (INDEPENDENT_AMBULATORY_CARE_PROVIDER_SITE_OTHER): Payer: Self-pay

## 2017-10-24 VITALS — BP 154/75 | HR 68 | Ht 67.5 in | Wt 213.0 lb

## 2017-10-24 DIAGNOSIS — R413 Other amnesia: Secondary | ICD-10-CM | POA: Diagnosis not present

## 2017-10-24 NOTE — Patient Instructions (Signed)
Your Plan:  Continue Aricept Memory score is stable If your symptoms worsen or you develop new symptoms please let us know.    Thank you for coming to see us at [facility]. I hope we have been able to provide you high quality care today.  You may receive a patient satisfaction survey over the next few weeks. We would appreciate your feedback and comments so that we may continue to improve ourselves and the health of our patients.  

## 2017-10-24 NOTE — Progress Notes (Signed)
PATIENT: Todd Cain DOB: May 02, 1941  REASON FOR VISIT: follow up HISTORY FROM: patient  HISTORY OF PRESENT ILLNESS: Today 10/24/17 Todd Cain is a 76 year old male with a history of memory disturbance.  He returns today for follow-up.  His wife is with him today.  She reports that he has tolerated the increase in Aricept well.  She does not feel that he is getting worse.  He does live at home with her.  He is able to complete all ADLs independently.  He does operate a motor vehicle but only drives to familiar places.  The patient's wife reports that he is sometimes isolated due to hearing deficit.  He continues to help pay the bills and write checks.  She does note that last month he missed some pain meds therefore she will supervise this but continue to let him do it.  Patient denies any trouble sleeping.  They return today for an evaluation.  HISTORY Todd Cain is a 76 year old right-handed white male with a history of a memory disturbance that began within the last year. The patient has become somewhat forgetful, he has had troubles with directions with driving. He has gotten lost going to places that he should know how to get to. The patient is very hard of hearing. The patient otherwise is able to keep up with his finances, he manages his own medications and appointments. The patient is good at taking notes, he is very organized. He occasionally may misplace things about the house. He has noted some slight imbalance, he denies any falls. He denies weakness of the extremities or numbness of the extremities. He does have a history of some urinary incontinence following surgery for prostate cancer. Occasionally he may have a left occipital headache. He denies any family history of memory problems. He is sent to this office for further evaluation. He recently was placed on Aricept, so far he is tolerating the 5 mg Aricept dose over the last 2 weeks.    REVIEW OF SYSTEMS: Out of a complete  14 system review of symptoms, the patient complains only of the following symptoms, and all other reviewed systems are negative.  Memory loss, frequency of urination, loss of vision, hearing loss  ALLERGIES: No Known Allergies  HOME MEDICATIONS: Outpatient Medications Prior to Visit  Medication Sig Dispense Refill  . amLODipine-benazepril (LOTREL) 5-10 MG per capsule Take 1 capsule by mouth daily.    Marland Kitchen aspirin 81 MG tablet Take 81 mg by mouth daily.     . benazepril (LOTENSIN) 10 MG tablet Take 10 mg by mouth daily.  3  . buPROPion (WELLBUTRIN XL) 300 MG 24 hr tablet Take 300 mg by mouth daily.    . carvedilol (COREG) 12.5 MG tablet Take 12.5 mg by mouth 2 (two) times daily with a meal.    . COMBIGAN 0.2-0.5 % ophthalmic solution INSTILL 1 DROP INTO BOTH EYES EVERY 12 HOURS  3  . donepezil (ARICEPT) 10 MG tablet Take 1 tablet (10 mg total) by mouth at bedtime. 30 tablet 3  . dorzolamide (TRUSOPT) 2 % ophthalmic solution INSTILL 1 DROP INTO RIGHT EYE 2 TIMES EVERY DAY  3  . fish oil-omega-3 fatty acids 1000 MG capsule Take 2 g by mouth daily.    Marland Kitchen ibuprofen (ADVIL,MOTRIN) 200 MG tablet Take 400 mg by mouth every 6 (six) hours as needed for pain.    Marland Kitchen latanoprost (XALATAN) 0.005 % ophthalmic solution INSTILL 1 DROP BY OPHTHALMIC ROUTE EVERY DAY INTO BOTH  EYES IN THE EVENING  5  . omeprazole (PRILOSEC) 20 MG capsule Take 20 mg by mouth daily.    Marland Kitchen oxyCODONE-acetaminophen (PERCOCET/ROXICET) 5-325 MG per tablet Take 1 tablet by mouth every 4 (four) hours as needed for pain. 10 tablet 0  . pravastatin (PRAVACHOL) 40 MG tablet Take 40 mg by mouth daily.     No facility-administered medications prior to visit.     PAST MEDICAL HISTORY: Past Medical History:  Diagnosis Date  . Cancer ([place])   . COPD (chronic obstructive pulmonary disease) ([place])   . HOH (hard of hearing) 04/20/2017  . Hypertension   . Memory disorder 04/20/2017    PAST SURGICAL HISTORY: Past Surgical History:  Procedure  Laterality Date  . HERNIA REPAIR    . PROSTATE SURGERY      FAMILY HISTORY: No family history on file.  SOCIAL HISTORY: Social History   Socioeconomic History  . Marital status: Married    Spouse name: Not on file  . Number of children: Not on file  . Years of education: Not on file  . Highest education level: Not on file  Social Needs  . Financial resource strain: Not on file  . Food insecurity - worry: Not on file  . Food insecurity - inability: Not on file  . Transportation needs - medical: Not on file  . Transportation needs - non-medical: Not on file  Occupational History  . Occupation: Retired  Tobacco Use  . Smoking status: Former Smoker    Last attempt to quit: 11/29/2010    Years since quitting: 6.9  . Smokeless tobacco: Never Used  Substance and Sexual Activity  . Alcohol use: No  . Drug use: No  . Sexual activity: Not on file  Other Topics Concern  . Not on file  Social History Narrative   Lives with wife   Caffeine use: 2 cups coffee per day   Right handed       PHYSICAL EXAM  Vitals:   10/24/17 1042  BP: (!) 154/75  Pulse: 68  Weight: 213 lb (96.6 kg)  Height: 5' 7.5" (1.715 m)   Body mass index is 32.87 kg/m.  MMSE - Mini Mental State Exam 10/24/2017 04/20/2017  Orientation to time 4 5  Orientation to Place 5 4  Registration 3 3  Attention/ Calculation 5 5  Recall 0 0  Language- name 2 objects 2 2  Language- repeat 1 1  Language- follow 3 step command 3 3  Language- read & follow direction 1 1  Write a sentence 1 1  Copy design 1 1  Total score 26 26     Generalized: Well developed, in no acute distress   Neurological examination  Mentation: Alert oriented to time, place, history taking. Follows all commands speech and language fluent Cranial nerve II-XII: Pupils were equal round reactive to light. Extraocular movements were full, visual field were full on confrontational test. Facial sensation and strength were normal. Uvula  tongue midline. Head turning and shoulder shrug  were normal and symmetric.  Hearing loss Motor: The motor testing reveals 5 over 5 strength of all 4 extremities. Good symmetric motor tone is noted throughout.  Sensory: Sensory testing is intact to soft touch on all 4 extremities. No evidence of extinction is noted.  Coordination: Cerebellar testing reveals good finger-nose-finger and heel-to-shin bilaterally.  Gait and station: Gait is normal.    DIAGNOSTIC DATA (LABS, IMAGING, TESTING) - I reviewed patient records, labs, notes, testing and imaging myself where  available.  Lab Results  Component Value Date   WBC 11.2 (H) 01/28/2013   HGB 15.1 01/28/2013   HCT 44.0 01/28/2013   MCV 83.5 01/28/2013   PLT 283 01/28/2013      Component Value Date/Time   NA 138 01/28/2013 1920   K 4.6 01/28/2013 1920   CL 103 01/28/2013 1920   CO2 24 01/28/2013 1920   GLUCOSE 93 01/28/2013 1920   BUN 17 01/28/2013 1920   CREATININE 1.62 (H) 01/28/2013 1920   CALCIUM 9.8 01/28/2013 1920   GFRNONAA 40 (L) 01/28/2013 1920   GFRAA 47 (L) 01/28/2013 1920      ASSESSMENT AND PLAN 76 y.o. year old male  has a past medical history of Cancer ([place]), COPD (chronic obstructive pulmonary disease) ([place]), HOH (hard of hearing) (04/20/2017), Hypertension, and Memory disorder (04/20/2017). here with:  1.  Memory disturbance  Overall the patient is doing well.  His memory score has remained stable.  He will continue on Aricept 10 mg at bedtime.  He is advised that if his symptoms worsen or he develops new symptoms he should let us know.  He will follow-up in 6 months or sooner if needed.  I spent 15 minutes with the patient. 50% of this time was spent discussing his memory score.    Ward Givens, MSN, NP-C 10/24/2017, 11:12 AM [facility] [address] [place], [place] [address] [phone]

## 2017-10-24 NOTE — Progress Notes (Signed)
I have read the note, and I agree with the clinical assessment and plan.  Kayton Dunaj K Adrien Shankar   

## 2018-04-25 ENCOUNTER — Encounter: Payer: Self-pay | Admitting: Adult Health

## 2018-04-25 ENCOUNTER — Ambulatory Visit: Payer: Federal, State, Local not specified - PPO | Admitting: Adult Health

## 2018-04-25 VITALS — BP 130/72 | HR 67 | Ht 67.5 in | Wt 213.6 lb

## 2018-04-25 DIAGNOSIS — R413 Other amnesia: Secondary | ICD-10-CM | POA: Diagnosis not present

## 2018-04-25 NOTE — Progress Notes (Signed)
PATIENT: Todd Cain DOB: 07/01/1941  REASON FOR VISIT: follow up HISTORY FROM: patient  HISTORY OF PRESENT ILLNESS: Today 04/25/18 Todd Cain is a 77 year old male with a history of memory disturbance.  He returns today for follow-up.  He continues to live at home with his wife.  He feels that his memory has remained stable.  He is able to complete all ADLs independently.  Reports a good appetite.  Denies any trouble sleeping.  Denies any changes in his mood or behavior.  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  HISTORY 10/24/17 Todd Cain is a 77 year old male with a history of memory disturbance.  He returns today for follow-up.  His wife is with him today.  She reports that he has tolerated the increase in Aricept well.  She does not feel that he is getting worse.  He does live at home with her.  He is able to complete all ADLs independently.  He does operate a motor vehicle but only drives to familiar places.  The patient's wife reports that he is sometimes isolated due to hearing deficit.  He continues to help pay the bills and write checks.  She does note that last month he missed some pain meds therefore she will supervise this but continue to let him do it.  Patient denies any trouble sleeping.  They return today for an evaluation.   REVIEW OF SYSTEMS: Out of a complete 14 system review of symptoms, the patient complains only of the following symptoms, and all other reviewed systems are negative.  Insomnia, frequent waking, hearing loss  ALLERGIES: No Known Allergies  HOME MEDICATIONS: Outpatient Medications Prior to Visit  Medication Sig Dispense Refill  . amLODipine-benazepril (LOTREL) 5-10 MG per capsule Take 1 capsule by mouth daily.    Marland Kitchen aspirin 81 MG tablet Take 81 mg by mouth daily.     . benazepril (LOTENSIN) 10 MG tablet Take 10 mg by mouth daily.  3  . buPROPion (WELLBUTRIN XL) 300 MG 24 hr tablet Take 300 mg by mouth daily.    .  carvedilol (COREG) 12.5 MG tablet Take 12.5 mg by mouth 2 (two) times daily with a meal.    . COMBIGAN 0.2-0.5 % ophthalmic solution INSTILL 1 DROP INTO BOTH EYES EVERY 12 HOURS  3  . donepezil (ARICEPT) 10 MG tablet Take 1 tablet (10 mg total) by mouth at bedtime. 30 tablet 3  . dorzolamide (TRUSOPT) 2 % ophthalmic solution INSTILL 1 DROP INTO RIGHT EYE 2 TIMES EVERY DAY  3  . ibuprofen (ADVIL,MOTRIN) 200 MG tablet Take 400 mg by mouth every 6 (six) hours as needed for pain.    Marland Kitchen latanoprost (XALATAN) 0.005 % ophthalmic solution INSTILL 1 DROP BY OPHTHALMIC ROUTE EVERY DAY INTO BOTH EYES IN THE EVENING  5  . omeprazole (PRILOSEC) 20 MG capsule Take 20 mg by mouth daily.    Marland Kitchen oxyCODONE-acetaminophen (PERCOCET/ROXICET) 5-325 MG per tablet Take 1 tablet by mouth every 4 (four) hours as needed for pain. 10 tablet 0  . pravastatin (PRAVACHOL) 40 MG tablet Take 40 mg by mouth daily.    . fish oil-omega-3 fatty acids 1000 MG capsule Take 2 g by mouth daily.     No facility-administered medications prior to visit.     PAST MEDICAL HISTORY: Past Medical History:  Diagnosis Date  . Cancer ([place])   . COPD (chronic obstructive pulmonary disease) ([place])   . HOH (hard of hearing) 04/20/2017  .  Hypertension   . 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 HERNIA REPAIR    . PROSTATE SURGERY      FAMILY HISTORY: No family history on file.  SOCIAL HISTORY: Social History   Socioeconomic History  . Marital status: Married    Spouse name: Not on file  . Number of children: Not on file  . Years of education: Not on file  . Highest education level: Not on file  Occupational History  . Occupation: Retired  Scientific laboratory technician  . Financial resource strain: Not on file  . Food insecurity:    Worry: Not on file    Inability: Not on file  . Transportation needs:    Medical: Not on file    Non-medical: Not on file  Tobacco Use  . Smoking  status: Former Smoker    Last attempt to quit: 11/29/2010    Years since quitting: 7.4  . Smokeless tobacco: Never Used  Substance and Sexual Activity  . Alcohol use: No  . Drug use: No  . Sexual activity: Not on file  Lifestyle  . Physical activity:    Days per week: Not on file    Minutes per session: Not on file  . Stress: Not on file  Relationships  . Social connections:    Talks on phone: Not on file    Gets together: Not on file    Attends religious service: Not on file    Active member of club or organization: Not on file    Attends meetings of clubs or organizations: Not on file    Relationship status: Not on file  . Intimate partner violence:    Fear of current or ex partner: Not on file    Emotionally abused: Not on file    Physically abused: Not on file    Forced sexual activity: Not on file  Other Topics Concern  . Not on file  Social History Narrative   Lives with wife   Caffeine use: 2 cups coffee per day   Right handed       PHYSICAL EXAM  Vitals:   04/25/18 1248  BP: 130/72  Pulse: 67  Weight: 213 lb 9.6 oz (96.9 kg)  Height: 5' 7.5" (1.715 m)   Body mass index is 32.96 kg/m.  Generalized: Well developed, in no acute distress   Neurological examination  Mentation: Alert oriented to time, place, history taking. Follows all commands speech and language fluent Cranial nerve II-XII: Pupils were equal round reactive to light. Extraocular movements were full, visual field were full on confrontational test. Facial sensation and strength were normal. Uvula tongue midline. Head turning and shoulder shrug  were normal and symmetric. Motor: The motor testing reveals 5 over 5 strength of all 4 extremities. Good symmetric motor tone is noted throughout.  Sensory: Sensory testing is intact to soft touch on all 4 extremities. No evidence of extinction is noted.  Coordination: Cerebellar testing reveals good finger-nose-finger and heel-to-shin bilaterally.  Gait  and station: Gait is normal.  Reflexes: Deep tendon reflexes are symmetric and normal bilaterally.   DIAGNOSTIC DATA (LABS, IMAGING, TESTING) - I reviewed patient records, labs, notes, testing and imaging myself where available.  Lab Results  Component Value Date   WBC 11.2 (H) 01/28/2013   HGB 15.1 01/28/2013   HCT 44.0 01/28/2013   MCV 83.5 01/28/2013   PLT 283 01/28/2013      Component Value Date/Time  NA 138 01/28/2013 1920   K 4.6 01/28/2013 1920   CL 103 01/28/2013 1920   CO2 24 01/28/2013 1920   GLUCOSE 93 01/28/2013 1920   BUN 17 01/28/2013 1920   CREATININE 1.62 (H) 01/28/2013 1920   CALCIUM 9.8 01/28/2013 1920   GFRNONAA 40 (L) 01/28/2013 1920   GFRAA 47 (L) 01/28/2013 1920      ASSESSMENT AND PLAN 77 y.o. year old male  has a past medical history of Cancer ([place]), COPD (chronic obstructive pulmonary disease) ([place]), HOH (hard of hearing) (04/20/2017), Hypertension, and Memory disorder (04/20/2017). here with:  1.  Memory disturbance  The patient's memory score has remained stable.  He will continue on Aricept 10 mg at bedtime.  I have advised that if his symptoms worsen or he develops new symptoms he should let us know.  He will follow-up in 6 months or sooner if needed.   I spent 15 minutes with the patient. 50% of this time was spent discussing his memory score   Ward Givens, MSN, NP-C 04/25/2018, 1:24 PM [facility] [address] [place], [place] [address] [phone]

## 2018-04-25 NOTE — Progress Notes (Signed)
I have read the note, and I agree with the clinical assessment and plan.  Todd Cain   

## 2018-04-25 NOTE — Patient Instructions (Signed)
Your Plan:  Continue Aricept Memory score is stable If your symptoms worsen or you develop new symptoms please let us know.    Thank you for coming to see us at [facility]. I hope we have been able to provide you high quality care today.  You may receive a patient satisfaction survey over the next few weeks. We would appreciate your feedback and comments so that we may continue to improve ourselves and the health of our patients.  

## 2018-11-20 ENCOUNTER — Ambulatory Visit: Payer: Federal, State, Local not specified - PPO | Admitting: Adult Health

## 2018-11-20 ENCOUNTER — Encounter: Payer: Self-pay | Admitting: Adult Health

## 2018-11-20 VITALS — BP 155/79 | HR 96 | Ht 67.5 in | Wt 205.0 lb

## 2018-11-20 DIAGNOSIS — R413 Other amnesia: Secondary | ICD-10-CM

## 2018-11-20 MED ORDER — MEMANTINE HCL 28 X 5 MG & 21 X 10 MG PO TABS: ORAL_TABLET | ORAL | 0 refills | Status: DC

## 2018-11-20 NOTE — Progress Notes (Signed)
I have read the note, and I agree with the clinical assessment and plan.  Todd Cain   

## 2018-11-20 NOTE — Progress Notes (Signed)
PATIENT: Alijah Akram DOB: 04/22/1941  REASON FOR VISIT: follow up HISTORY FROM: patient  HISTORY OF PRESENT ILLNESS: Today 11/20/18:  Mr. Testerman is a 78 year old male with a history of memory disturbance.  He returns today for follow-up.  He is currently on Aricept 10 mg at bedtime.  Reports that he continues to live at home with his wife.  He is able to complete all ADLs independently.  Wife manages finances.  His wife manages his medications and appointments..He no longer operates a motor vehicle due to his vision.  Denies any trouble sleeping.  Denies hallucinations.  No change in his mood or behavior.  He returns today for evaluation.  HISTORY 04/25/18 Mr. Vital is a 78 year old male with a history of memory disturbance.  He returns today for follow-up.  He continues to live at home with his wife.  He feels that his memory has remained stable.  He is able to complete all ADLs independently.  Reports a good appetite.  Denies any trouble sleeping.  Denies any changes in his mood or behavior.  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  REVIEW OF SYSTEMS: Out of a complete 14 system review of symptoms, the patient complains only of the following symptoms, and all other reviewed systems are negative.  See HPI  ALLERGIES: No Known Allergies  HOME MEDICATIONS: Outpatient Medications Prior to Visit  Medication Sig Dispense Refill  . amLODipine-benazepril (LOTREL) 5-10 MG per capsule Take 1 capsule by mouth daily.    Marland Kitchen aspirin 81 MG tablet Take 81 mg by mouth daily.     . benazepril (LOTENSIN) 10 MG tablet Take 10 mg by mouth daily.  3  . buPROPion (WELLBUTRIN XL) 300 MG 24 hr tablet Take 300 mg by mouth daily.    . carvedilol (COREG) 12.5 MG tablet Take 12.5 mg by mouth 2 (two) times daily with a meal.    . COMBIGAN 0.2-0.5 % ophthalmic solution INSTILL 1 DROP INTO BOTH EYES EVERY 12 HOURS  3  . donepezil (ARICEPT) 10 MG tablet Take 1 tablet (10 mg total)  by mouth at bedtime. 30 tablet 3  . dorzolamide (TRUSOPT) 2 % ophthalmic solution INSTILL 1 DROP INTO RIGHT EYE 2 TIMES EVERY DAY  3  . fish oil-omega-3 fatty acids 1000 MG capsule Take 2 g by mouth daily.    Marland Kitchen ibuprofen (ADVIL,MOTRIN) 200 MG tablet Take 400 mg by mouth every 6 (six) hours as needed for pain.    Marland Kitchen latanoprost (XALATAN) 0.005 % ophthalmic solution INSTILL 1 DROP BY OPHTHALMIC ROUTE EVERY DAY INTO BOTH EYES IN THE EVENING  5  . omeprazole (PRILOSEC) 20 MG capsule Take 20 mg by mouth daily.    Marland Kitchen oxyCODONE-acetaminophen (PERCOCET/ROXICET) 5-325 MG per tablet Take 1 tablet by mouth every 4 (four) hours as needed for pain. 10 tablet 0  . pravastatin (PRAVACHOL) 40 MG tablet Take 40 mg by mouth daily.     No facility-administered medications prior to visit.     PAST MEDICAL HISTORY: Past Medical History:  Diagnosis Date  . Cancer ([place])   . COPD (chronic obstructive pulmonary disease) ([place])   . HOH (hard of hearing) 04/20/2017  . Hypertension   . 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 HERNIA REPAIR    . PROSTATE SURGERY      FAMILY HISTORY: No family history on file.  SOCIAL HISTORY: Social History  Socioeconomic History  . Marital status: Married    Spouse name: Not on file  . Number of children: Not on file  . Years of education: Not on file  . Highest education level: Not on file  Occupational History  . Occupation: Retired  Scientific laboratory technician  . Financial resource strain: Not on file  . Food insecurity:    Worry: Not on file    Inability: Not on file  . Transportation needs:    Medical: Not on file    Non-medical: Not on file  Tobacco Use  . Smoking status: Former Smoker    Last attempt to quit: 11/29/2010    Years since quitting: 7.9  . Smokeless tobacco: Never Used  Substance and Sexual Activity  . Alcohol use: No  . Drug use: No  . Sexual activity: Not on file  Lifestyle  . Physical  activity:    Days per week: Not on file    Minutes per session: Not on file  . Stress: Not on file  Relationships  . Social connections:    Talks on phone: Not on file    Gets together: Not on file    Attends religious service: Not on file    Active member of club or organization: Not on file    Attends meetings of clubs or organizations: Not on file    Relationship status: Not on file  . Intimate partner violence:    Fear of current or ex partner: Not on file    Emotionally abused: Not on file    Physically abused: Not on file    Forced sexual activity: Not on file  Other Topics Concern  . Not on file  Social History Narrative   Lives with wife   Caffeine use: 2 cups coffee per day   Right handed       PHYSICAL EXAM  Vitals:   11/20/18 1346  BP: (!) 155/79  Pulse: 96  Weight: 205 lb (93 kg)  Height: 5' 7.5" (1.715 m)   Body mass index is 31.63 kg/m.   MMSE - Mini Mental State Exam 11/20/2018 04/25/2018 10/24/2017  Orientation to time 3 4 4   Orientation to Place 4 5 5   Registration 2 3 3   Attention/ Calculation 5 5 5   Recall 1 2 0  Language- name 2 objects 2 2 2   Language- repeat 1 1 1   Language- follow 3 step command 3 3 3   Language- read & follow direction 1 1 1   Write a sentence 1 1 1   Copy design 0 1 1  Total score 23 28 26      Generalized: Well developed, in no acute distress   Neurological examination  Mentation: Alert. Follows all commands speech and language fluent Cranial nerve II-XII: Pupils were equal round reactive to light. Extraocular movements were full, visual field were full on confrontational test. Facial sensation and strength were normal. Uvula tongue midline. Head turning and shoulder shrug  were normal and symmetric. bilateral hearing loss Motor: The motor testing reveals 5 over 5 strength of all 4 extremities. Good symmetric motor tone is noted throughout.  Sensory: Sensory testing is intact to soft touch on all 4 extremities. No  evidence of extinction is noted.  Coordination: Cerebellar testing reveals good finger-nose-finger and heel-to-shin bilaterally.  Gait and station: Gait is normal.   DIAGNOSTIC DATA (LABS, IMAGING, TESTING) - I reviewed patient records, labs, notes, testing and imaging myself where available.  Lab Results  Component Value Date  WBC 11.2 (H) 01/28/2013   HGB 15.1 01/28/2013   HCT 44.0 01/28/2013   MCV 83.5 01/28/2013   PLT 283 01/28/2013      Component Value Date/Time   NA 138 01/28/2013 1920   K 4.6 01/28/2013 1920   CL 103 01/28/2013 1920   CO2 24 01/28/2013 1920   GLUCOSE 93 01/28/2013 1920   BUN 17 01/28/2013 1920   CREATININE 1.62 (H) 01/28/2013 1920   CALCIUM 9.8 01/28/2013 1920   GFRNONAA 40 (L) 01/28/2013 1920   GFRAA 47 (L) 01/28/2013 1920      ASSESSMENT AND PLAN 78 y.o. year old male  has a past medical history of Cancer ([place]), COPD (chronic obstructive pulmonary disease) ([place]), HOH (hard of hearing) (04/20/2017), Hypertension, and Memory disorder (04/20/2017). here with:  1.  Memory disturbance  The patient's memory score has declined slightly.  He will continue on Aricept 10 mg at bedtime.  I advised that we can consider starting Namenda.  I have reviewed potential side effects with the patient and his wife.  Also provided him with a handout.  We will start with a Namenda titration pack.  I have asked the wife to call at the beginning of week 4.  If he is tolerating this well we will send in a new prescription.  I have advised that if his symptoms worsen or he develops new symptoms he should let us know.  He will follow-up in 6 months or sooner if needed.  I spent 15 minutes with the patient. 50% of this time was spent reviewing memory score   Ward Givens, MSN, NP-C 11/20/2018, 1:44 PM [facility] [address], [place], [place] [address] [phone]

## 2018-11-20 NOTE — Patient Instructions (Signed)
Your Plan:  Continue Aricept  Start namenda- at the beginning of week 4 of titration pack call for new script If your symptoms worsen or you develop new symptoms please let us know.   Thank you for coming to see [place] at [facility]. I hope we have been able to provide you high quality care today.  You may receive a patient satisfaction survey over the next few weeks. We would appreciate your feedback and comments so that we may continue to improve ourselves and the health of our patients.  Memantine Tablets What is this medicine? MEMANTINE (MEM an teen) is used to treat dementia caused by Alzheimer's disease. This medicine may be used for other purposes; ask your health care provider or pharmacist if you have questions. COMMON BRAND NAME(S): Namenda What should I tell my health care provider before I take this medicine? They need to know if you have any of these conditions: -difficulty passing urine -kidney disease -liver disease -seizures -an unusual or allergic reaction to memantine, other medicines, foods, dyes, or preservatives -pregnant or trying to get pregnant -breast-feeding How should I use this medicine? Take this medicine by mouth with a glass of water. Follow the directions on the prescription label. You may take this medicine with or without food. Take your doses at regular intervals. Do not take your medicine more often than directed. Continue to take your medicine even if you feel better. Do not stop taking except on the advice of your doctor or health care professional. Talk to your pediatrician regarding the use of this medicine in children. Special care may be needed. Overdosage: If you think you have taken too much of this medicine contact a poison control center or emergency room at once. NOTE: This medicine is only for you. Do not share this medicine with others. What if I miss a dose? If you miss a dose, take it as soon as you can. If it is almost  time for your next dose, take only that dose. Do not take double or extra doses. If you do not take your medicine for several days, contact your health care provider. Your dose may need to be changed. What may interact with this medicine? -acetazolamide -amantadine -cimetidine -dextromethorphan -dofetilide -hydrochlorothiazide -ketamine -metformin -methazolamide -quinidine -ranitidine -sodium bicarbonate -triamterene This list may not describe all possible interactions. Give your health care provider a list of all the medicines, herbs, non-prescription drugs, or dietary supplements you use. Also tell them if you smoke, drink alcohol, or use illegal drugs. Some items may interact with your medicine. What should I watch for while using this medicine? Visit your doctor or health care professional for regular checks on your progress. Check with your doctor or health care professional if there is no improvement in your symptoms or if they get worse. You may get drowsy or dizzy. Do not drive, use machinery, or do anything that needs mental alertness until you know how this drug affects you. Do not stand or sit up quickly, especially if you are an older patient. This reduces the risk of dizzy or fainting spells. Alcohol can make you more drowsy and dizzy. Avoid alcoholic drinks. What side effects may I notice from receiving this medicine? Side effects that you should report to your doctor or health care professional as soon as possible: -allergic reactions like skin rash, itching or hives, swelling of the face, lips, or tongue -agitation or a feeling of restlessness -depressed mood -dizziness -hallucinations -redness, blistering, peeling or  loosening of the skin, including inside the mouth -seizures -vomiting Side effects that usually do not require medical attention (report to your doctor or health care professional if they continue or are  bothersome): -constipation -diarrhea -headache -nausea -trouble sleeping This list may not describe all possible side effects. Call your doctor for medical advice about side effects. You may report side effects to FDA at 1-800-FDA-1088. Where should I keep my medicine? Keep out of the reach of children. Store at room temperature between 15 degrees and 30 degrees C (59 degrees and 86 degrees F). Throw away any unused medicine after the expiration date. NOTE: This sheet is a summary. It may not cover all possible information. If you have questions about this medicine, talk to your doctor, pharmacist, or health care provider.  2019 Elsevier/Gold Standard (2013-08-12 14:10:42)

## 2018-12-19 ENCOUNTER — Telehealth: Payer: Self-pay | Admitting: Neurology

## 2018-12-19 MED ORDER — MEMANTINE HCL 10 MG PO TABS: 10.0000 mg | ORAL_TABLET | Freq: Two times a day (BID) | ORAL | 3 refills | Status: DC

## 2018-12-19 NOTE — Telephone Encounter (Signed)
Chart reviewed a refill request for namenda 10 mg bid appropriate to send. E-scrip sent to [facility] and confirmation received.

## 2018-12-19 NOTE — Telephone Encounter (Signed)
Pts wife Mary-Ann (on [place]) called to inform us that the memantine (NAMENDA TITRATION PAK) tablet pack is working and she has notice improvement in his memory. She would like the Memantine 10mg  BID [facility]

## 2018-12-19 NOTE — Addendum Note (Signed)
Addended by: Verlin Grills T on: 12/19/2018 03:38 PM   Modules accepted: Orders

## 2019-01-11 ENCOUNTER — Other Ambulatory Visit: Payer: Self-pay | Admitting: Adult Health

## 2019-01-11 MED ORDER — MEMANTINE HCL 10 MG PO TABS: 10.0000 mg | ORAL_TABLET | Freq: Two times a day (BID) | ORAL | 0 refills | Status: DC

## 2019-01-11 NOTE — Telephone Encounter (Signed)
Received pharmacy request for 90 day supply of namenda. E scribed for 90 days with 0 refills as per previous refill.

## 2019-01-11 NOTE — Addendum Note (Signed)
Addended by: Minna Antis on: 01/11/2019 09:40 AM   Modules accepted: Orders

## 2019-04-07 ENCOUNTER — Other Ambulatory Visit: Payer: Self-pay | Admitting: Neurology

## 2019-05-29 ENCOUNTER — Ambulatory Visit: Payer: Federal, State, Local not specified - PPO | Admitting: Neurology

## 2019-05-29 ENCOUNTER — Telehealth: Payer: Self-pay | Admitting: Neurology

## 2019-05-29 ENCOUNTER — Telehealth: Payer: Self-pay

## 2019-05-29 NOTE — Telephone Encounter (Signed)
This patient did not show for a revisit appointment today. 

## 2019-05-29 NOTE — Telephone Encounter (Signed)
PT no show for appt today.

## 2019-06-07 ENCOUNTER — Other Ambulatory Visit: Payer: Self-pay | Admitting: Neurology

## 2019-06-17 ENCOUNTER — Telehealth: Payer: Self-pay

## 2019-06-17 NOTE — Telephone Encounter (Signed)
Patient's wife called stating that her husband hasn't taken a shower in 2 weeks. Every time she tells him to take a shower he tells her to leave him alone. She would like to know what she should in this situation. She also mentioned that she hasn't seen much change since the last office visit. Please advise

## 2019-06-18 NOTE — Telephone Encounter (Signed)
Pts wife called in and stated she needs to speak with someone concerning her husbands mental capacity

## 2019-06-18 NOTE — Telephone Encounter (Signed)
I called and spoke to wife.  Pt is refusing to take shower.  Gets agitated when reminded.  Last RV was no sshow (due to quarantine for covid.  Made appt tomorrow 06-19-19 at 1430 6 mo RV.  She was appreciative.

## 2019-06-19 ENCOUNTER — Encounter: Payer: Self-pay | Admitting: Adult Health

## 2019-06-19 ENCOUNTER — Ambulatory Visit: Payer: Federal, State, Local not specified - PPO | Admitting: Adult Health

## 2019-06-19 ENCOUNTER — Other Ambulatory Visit: Payer: Self-pay

## 2019-06-19 VITALS — BP 125/59 | HR 85 | Temp 97.3°F | Ht 67.5 in | Wt 220.0 lb

## 2019-06-19 DIAGNOSIS — R413 Other amnesia: Secondary | ICD-10-CM | POA: Diagnosis not present

## 2019-06-19 NOTE — Progress Notes (Signed)
I have read the note, and I agree with the clinical assessment and plan.  Nathalee Smarr K Remi Lopata   

## 2019-06-19 NOTE — Progress Notes (Signed)
PATIENT: Todd Cain DOB: [birth year]  REASON FOR VISIT: follow up HISTORY FROM: patient  HISTORY OF PRESENT ILLNESS: Today 06/19/19:  Mr. Baccam is an 78 year old male with a history of memory disturbance.  He returns today for follow-up.  Today he did not put the battery in his hearing aid.  He is extremely hard of hearing.  He has a hard time even hearing his wife speak.  His wife states that he has been sleeping a lot during the day.  He continues to sleep well at night.  She states that she also has a hard time getting him to bathe each day.  She states that he is gone 12 days without a bath.  She reports that her son also has tried but he is adamant that they are lying to him.  He remains on Aricept and Namenda.  He is able to complete all ADLs independently.  He does not operate a motor vehicle.  She handles all the finances.  HISTORY 11/20/18:  Mr. Hillmer is a 78 year old male with a history of memory disturbance.  He returns today for follow-up.  He is currently on Aricept 10 mg at bedtime.  Reports that he continues to live at home with his wife.  He is able to complete all ADLs independently.  Wife manages finances.  His wife manages his medications and appointments..He no longer operates a motor vehicle due to his vision.  Denies any trouble sleeping.  Denies hallucinations.  No change in his mood or behavior.  He returns today for evaluation.  REVIEW OF SYSTEMS: Out of a complete 14 system review of symptoms, the patient complains only of the following symptoms, and all other reviewed systems are negative.  See HPI  ALLERGIES: No Known Allergies  HOME MEDICATIONS: Outpatient Medications Prior to Visit  Medication Sig Dispense Refill   amLODipine-benazepril (LOTREL) 5-10 MG per capsule Take 1 capsule by mouth daily.     aspirin 81 MG tablet Take 81 mg by mouth daily.      buPROPion (WELLBUTRIN XL) 300 MG 24 hr tablet Take 300 mg by mouth daily.     carvedilol  (COREG) 12.5 MG tablet Take 12.5 mg by mouth 2 (two) times daily with a meal.     donepezil (ARICEPT) 10 MG tablet Take 1 tablet (10 mg total) by mouth at bedtime. 30 tablet 3   escitalopram (LEXAPRO) 20 MG tablet Take 20 mg by mouth daily.     fish oil-omega-3 fatty acids 1000 MG capsule Take 2 g by mouth daily.     memantine (NAMENDA) 10 MG tablet TAKE 1 TABLET BY MOUTH TWICE A DAY 180 tablet 0   omeprazole (PRILOSEC) 20 MG capsule Take 20 mg by mouth daily.     pravastatin (PRAVACHOL) 40 MG tablet Take 40 mg by mouth daily.     benazepril (LOTENSIN) 10 MG tablet Take 10 mg by mouth daily.  3   COMBIGAN 0.2-0.5 % ophthalmic solution INSTILL 1 DROP INTO BOTH EYES EVERY 12 HOURS  3   dorzolamide (TRUSOPT) 2 % ophthalmic solution INSTILL 1 DROP INTO RIGHT EYE 2 TIMES EVERY DAY  3   ibuprofen (ADVIL,MOTRIN) 200 MG tablet Take 400 mg by mouth every 6 (six) hours as needed for pain.     latanoprost (XALATAN) 0.005 % ophthalmic solution INSTILL 1 DROP BY OPHTHALMIC ROUTE EVERY DAY INTO BOTH EYES IN THE EVENING  5   oxyCODONE-acetaminophen (PERCOCET/ROXICET) 5-325 MG per tablet Take 1 tablet by  mouth every 4 (four) hours as needed for pain. (Patient not taking: Reported on 06/19/2019) 10 tablet 0   No facility-administered medications prior to visit.     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  SOCIAL HISTORY: Social History   Socioeconomic History   Marital status: Married    Spouse name: Government social research officer   Number of children: Not on file   Years of education: 14   Highest education level: Not on file  Occupational History   Occupation:  Retired  Scientist, product/process development strain: Not on file   Food insecurity    Worry: Not on file    Inability: Not on file   Transportation needs    Medical: Not on file    Non-medical: Not on file  Tobacco Use   Smoking status: Former Smoker    Quit date: 11/29/2010    Years since quitting: 8.5   Smokeless tobacco: Never Used  Substance and Sexual Activity   Alcohol use: No   Drug use: No   Sexual activity: Not on file  Lifestyle   Physical activity    Days per week: Not on file    Minutes per session: Not on file   Stress: Not on file  Relationships   Social connections    Talks on phone: Not on file    Gets together: Not on file    Attends religious service: Not on file    Active member of club or organization: Not on file    Attends meetings of clubs or organizations: Not on file    Relationship status: Not on file   Intimate partner violence    Fear of current or ex partner: Not on file    Emotionally abused: Not on file    Physically abused: Not on file    Forced sexual activity: Not on file  Other Topics Concern   Not on file  Social History Narrative   Lives with wife   Caffeine use: 2 cups coffee per day   Right handed       PHYSICAL EXAM  Vitals:   06/19/19 1413  BP: (!) 125/59  Pulse: 85  Temp: (!) 97.3 F (36.3 C)  TempSrc: Oral  Weight: 220 lb (99.8 kg)  Height: 5' 7.5" (1.715 m)   Body mass index is 33.95 kg/m.  Generalized: Well developed, in no acute distress   Neurological examination  Mentation: Alert oriented to time, place, history taking. Follows all commands speech and language fluent Cranial nerve II-XII: Extraocular movements were full, visual field were full on confrontational test.  Head turning and shoulder shrug  were normal and symmetric. Motor: The motor testing reveals 5 over 5 strength of all 4 extremities. Good symmetric motor tone is noted throughout.  Sensory: Sensory testing is intact to soft  touch on all 4 extremities. No evidence of extinction is noted.  Coordination: Cerebellar testing reveals good finger-nose-finger and heel-to-shin bilaterally.  Gait and station: Gait is normal.  lly.   DIAGNOSTIC DATA (LABS, IMAGING, TESTING) - I reviewed patient records, labs, notes, testing and imaging myself where  available.  Lab Results  Component Value Date   WBC 11.2 (H) 01/28/2013   HGB 15.1 01/28/2013   HCT 44.0 01/28/2013   MCV 83.5 01/28/2013   PLT 283 01/28/2013      Component Value Date/Time   NA 138 01/28/2013 1920   K 4.6 01/28/2013 1920   CL 103 01/28/2013 1920   CO2 24 01/28/2013 1920   GLUCOSE 93 01/28/2013 1920   BUN 17 01/28/2013 1920   CREATININE 1.62 (H) 01/28/2013 1920   CALCIUM 9.8 01/28/2013 1920   GFRNONAA 40 (L) 01/28/2013 1920   GFRAA 47 (L) 01/28/2013 1920   No results found for: CHOL, HDL, LDLCALC, LDLDIRECT, TRIG, CHOLHDL No results found for: HGBA1C Lab Results  Component Value Date   VITAMINB12 786 04/20/2017   No results found for: TSH    ASSESSMENT AND PLAN 78 y.o. year old male  has a past medical history of Cancer ([place]), COPD (chronic obstructive pulmonary disease) ([place]), HOH (hard of hearing) (04/20/2017), Hypertension, and Memory disorder (04/20/2017). here with:  1.  Memory disturbance  The patient is extremely hard of hearing.  We were unable to complete the memory testing today.  He will remain on Aricept and Namenda.  We discussed ordering home health to help with his hygienic needs.  The patient wife deferred for now.  I have advised that if his symptoms worsen or he develops new symptoms they should let us know.  He will follow-up in 3 to 4 months or sooner if needed.   I spent 15 minutes with the patient. 50% of this time was spent discussing plan of care   Ward Givens, MSN, NP-C 06/19/2019, 2:37 PM [facility] [address] [place], [place] [address] [phone]

## 2019-06-19 NOTE — Patient Instructions (Signed)
Your Plan:  Continue Aricept and Namenda If your symptoms worsen or you develop new symptoms please let us know.   Thank you for coming to see us at [facility]. I hope we have been able to provide you high quality care today.  You may receive a patient satisfaction survey over the next few weeks. We would appreciate your feedback and comments so that we may continue to improve ourselves and the health of our patients.  

## 2019-07-25 ENCOUNTER — Other Ambulatory Visit: Payer: Self-pay | Admitting: Adult Health

## 2019-09-23 ENCOUNTER — Telehealth: Payer: Self-pay | Admitting: Adult Health

## 2019-09-23 NOTE — Telephone Encounter (Signed)
Pt wife(on [place]) has called asking if pt can have a Virtual Visit with NP since they are not going out during the Pandemic, please call

## 2019-09-24 ENCOUNTER — Ambulatory Visit: Payer: Federal, State, Local not specified - PPO | Admitting: Neurology

## 2019-09-24 NOTE — Telephone Encounter (Signed)
Pt wife was called back to offer a VV with NP Sarah,phone rep left message asking wife to call office to schedule the follow up needed for pt.

## 2019-09-25 NOTE — Telephone Encounter (Signed)
Pt wife has called RN Lovey Newcomer back to inform they do not have a way of doing a virtual visit, wife states there have been no changes in pt so no pressing need for the appointment at this time.  If RN Lovey Newcomer needs to call that is fine

## 2019-09-25 NOTE — Telephone Encounter (Signed)
LMVM for pts wife to return call for setting up appt for pt virtually.

## 2019-10-21 ENCOUNTER — Other Ambulatory Visit: Payer: Self-pay | Admitting: Adult Health

## 2019-10-22 ENCOUNTER — Other Ambulatory Visit: Payer: Self-pay | Admitting: Adult Health

## 2019-10-23 ENCOUNTER — Other Ambulatory Visit: Payer: Self-pay | Admitting: Neurology

## 2020-02-05 ENCOUNTER — Other Ambulatory Visit: Payer: Self-pay | Admitting: Adult Health

## 2020-02-19 ENCOUNTER — Encounter: Payer: Self-pay | Admitting: Adult Health

## 2020-02-19 ENCOUNTER — Other Ambulatory Visit: Payer: Self-pay

## 2020-02-19 ENCOUNTER — Ambulatory Visit: Payer: Federal, State, Local not specified - PPO | Admitting: Adult Health

## 2020-02-19 VITALS — BP 160/78 | HR 74 | Temp 98.4°F | Ht 67.0 in | Wt 220.0 lb

## 2020-02-19 DIAGNOSIS — F0390 Unspecified dementia without behavioral disturbance: Secondary | ICD-10-CM

## 2020-02-19 NOTE — Patient Instructions (Signed)
Your Plan:  Continue Namenda and Aricept If your symptoms worsen or you develop new symptoms please let us know.   Thank you for coming to see [place] at [facility]. I hope we have been able to provide you high quality care today.  You may receive a patient satisfaction survey over the next few weeks. We would appreciate your feedback and comments so that we may continue to improve ourselves and the health of our patients.

## 2020-02-19 NOTE — Progress Notes (Signed)
I have read the note, and I agree with the clinical assessment and plan.  Charles K Willis   

## 2020-02-19 NOTE — Progress Notes (Signed)
PATIENT: Todd Cain DOB: 06-22-1941  REASON FOR VISIT: follow up HISTORY FROM: patient  HISTORY OF PRESENT ILLNESS: Today 02/19/20: Mr. Todd Cain is an 79 year old male with a history of memory disturbance.  He returns today for follow-up.  He only has an one hearing aid.  Wife states that he refuses to wear both hearing aids.  He is extremely hard of hearing despite having one hearing aid in.  Wife feels that his memory has gotten worse.  He continues to go a week or more without taking a bath.  He lives at home with his wife and son.  Wife reports that the son does try to help take care of him.  Patient is able to complete most ADLs independently but may need some prompting.  Wife reports that he sleeps often during the day.  He continues on Aricept and Namenda.  HISTORY 06/19/19:  Mr. Todd Cain is an 79 year old male with a history of memory disturbance.  He returns today for follow-up.  Today he did not put the battery in his hearing aid.  He is extremely hard of hearing.  He has a hard time even hearing his wife speak.  His wife states that he has been sleeping a lot during the day.  He continues to sleep well at night.  She states that she also has a hard time getting him to bathe each day.  She states that he is gone 12 days without a bath.  She reports that her son also has tried but he is adamant that they are lying to him.  He remains on Aricept and Namenda.  He is able to complete all ADLs independently.  He does not operate a motor vehicle.  She handles all the finances.  REVIEW OF SYSTEMS: Out of a complete 14 system review of symptoms, the patient complains only of the following symptoms, and all other reviewed systems are negative.  See HPI   ALLERGIES: No Known Allergies  HOME MEDICATIONS: Outpatient Medications Prior to Visit  Medication Sig Dispense Refill  . amLODipine-benazepril (LOTREL) 5-10 MG per capsule Take 1 capsule by mouth daily.    Marland Kitchen aspirin 81 MG tablet  Take 81 mg by mouth daily.     . benazepril (LOTENSIN) 10 MG tablet Take 10 mg by mouth daily.  3  . buPROPion (WELLBUTRIN XL) 300 MG 24 hr tablet Take 300 mg by mouth daily.    . carvedilol (COREG) 12.5 MG tablet Take 12.5 mg by mouth 2 (two) times daily with a meal.    . COMBIGAN 0.2-0.5 % ophthalmic solution INSTILL 1 DROP INTO BOTH EYES EVERY 12 HOURS  3  . donepezil (ARICEPT) 10 MG tablet Take 1 tablet (10 mg total) by mouth at bedtime. 30 tablet 3  . dorzolamide (TRUSOPT) 2 % ophthalmic solution INSTILL 1 DROP INTO RIGHT EYE 2 TIMES EVERY DAY  3  . escitalopram (LEXAPRO) 20 MG tablet Take 20 mg by mouth daily.    . fish oil-omega-3 fatty acids 1000 MG capsule Take 2 g by mouth daily.    Marland Kitchen ibuprofen (ADVIL,MOTRIN) 200 MG tablet Take 400 mg by mouth every 6 (six) hours as needed for pain.    Marland Kitchen latanoprost (XALATAN) 0.005 % ophthalmic solution INSTILL 1 DROP BY OPHTHALMIC ROUTE EVERY DAY INTO BOTH EYES IN THE EVENING  5  . memantine (NAMENDA) 10 MG tablet TAKE 1 TABLET BY MOUTH TWICE A DAY 60 tablet 0  . omeprazole (PRILOSEC) 20 MG capsule Take  20 mg by mouth daily.    . pravastatin (PRAVACHOL) 40 MG tablet Take 40 mg by mouth daily.     No facility-administered medications prior to visit.    PAST MEDICAL HISTORY: Past Medical History:  Diagnosis Date  . Cancer ([place])   . COPD (chronic obstructive pulmonary disease) ([place])   . HOH (hard of hearing) 04/20/2017  . Hypertension   . 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 HERNIA REPAIR    . PROSTATE SURGERY      FAMILY HISTORY: No family history on file.  SOCIAL HISTORY: Social History   Socioeconomic History  . Marital status: Married    Spouse name: Latanya Presser  . Number of children: Not on file  . Years of education: 30  . Highest education level: Not on file  Occupational History  . Occupation: Retired  Tobacco Use  . Smoking status: Former Smoker    Quit  date: 11/29/2010    Years since quitting: 9.2  . Smokeless tobacco: Never Used  Substance and Sexual Activity  . Alcohol use: No  . Drug use: No  . Sexual activity: Not on file  Other Topics Concern  . Not on file  Social History Narrative   Lives with wife   Caffeine use: 2 cups coffee per day   Right handed    Social Determinants of Health   Financial Resource Strain:   . Difficulty of Paying Living Expenses:   Food Insecurity:   . Worried About Charity fundraiser in the Last Year:   . Arboriculturist in the Last Year:   Transportation Needs:   . Film/video editor (Medical):   Marland Kitchen Lack of Transportation (Non-Medical):   Physical Activity:   . Days of Exercise per Week:   . Minutes of Exercise per Session:   Stress:   . Feeling of Stress :   Social Connections:   . Frequency of Communication with Friends and Family:   . Frequency of Social Gatherings with Friends and Family:   . Attends Religious Services:   . Active Member of Clubs or Organizations:   . Attends Archivist Meetings:   Marland Kitchen Marital Status:   Intimate Partner Violence:   . Fear of Current or Ex-Partner:   . Emotionally Abused:   Marland Kitchen Physically Abused:   . Sexually Abused:       PHYSICAL EXAM  Vitals:   02/19/20 1430  BP: (!) 160/78  Pulse: 74  Temp: 98.4 F (36.9 C)  Weight: 220 lb (99.8 kg)  Height: 5\' 7"  (1.702 m)   Body mass index is 34.46 kg/m.   MMSE - Mini Mental State Exam 02/19/2020 06/19/2019 11/20/2018  Not completed: Unable to complete Unable to complete -  Orientation to time - - 3  Orientation to Place - - 4  Registration - - 2  Attention/ Calculation - - 5  Recall - - 1  Language- name 2 objects - - 2  Language- repeat - - 1  Language- follow 3 step command - - 3  Language- read & follow direction - - 1  Write a sentence - - 1  Copy design - - 0  Total score - - 23      Generalized: Well developed, in no acute distress   Neurological examination    Mentation: Alert oriented to time, place, history taking. Follows all commands speech and language fluent Cranial nerve  II-XII: Pupils were equal round reactive to light. Extraocular movements were full, visual field were full on confrontational test. Head turning and shoulder shrug  were normal and symmetric.  Hard of hearing bilaterally Motor: The motor testing reveals 5 over 5 strength of all 4 extremities. Good symmetric motor tone is noted throughout.  Sensory: Sensory testing is intact to soft touch on all 4 extremities. No evidence of extinction is noted.  Coordination: Cerebellar testing reveals good finger-nose-finger and heel-to-shin bilaterally.  Gait and station: Gait is normal.  Reflexes: Deep tendon reflexes are symmetric and normal bilaterally.   DIAGNOSTIC DATA (LABS, IMAGING, TESTING) - I reviewed patient records, labs, notes, testing and imaging myself where available.  Lab Results  Component Value Date   WBC 11.2 (H) 01/28/2013   HGB 15.1 01/28/2013   HCT 44.0 01/28/2013   MCV 83.5 01/28/2013   PLT 283 01/28/2013      Component Value Date/Time   NA 138 01/28/2013 1920   K 4.6 01/28/2013 1920   CL 103 01/28/2013 1920   CO2 24 01/28/2013 1920   GLUCOSE 93 01/28/2013 1920   BUN 17 01/28/2013 1920   CREATININE 1.62 (H) 01/28/2013 1920   CALCIUM 9.8 01/28/2013 1920   GFRNONAA 40 (L) 01/28/2013 1920   GFRAA 47 (L) 01/28/2013 1920      ASSESSMENT AND PLAN 79 y.o. year old male  has a past medical history of Cancer ([place]), COPD (chronic obstructive pulmonary disease) ([place]), HOH (hard of hearing) (04/20/2017), Hypertension, and Memory disorder (04/20/2017). here with:  1.  Dementia  -Unable to test MMSE due to hearing -Continue Aricept and Namenda -Encouraged wife to consider a caregiver in the home. -Advised if symptoms worsen or he develops new symptoms they should let us know.  Follow-up in 6 months or sooner if needed  I spent 30 minutes of face-to-face and  non-face-to-face time with patient.  This included previsit chart review, lab review, study review, order entry, electronic health record documentation, patient education.  Ward Givens, MSN, NP-C 02/19/2020, 2:26 PM [facility] [address] [place], [place] [address] [phone]

## 2020-03-10 ENCOUNTER — Other Ambulatory Visit: Payer: Self-pay | Admitting: Adult Health

## 2020-06-03 ENCOUNTER — Other Ambulatory Visit: Payer: Self-pay

## 2020-06-03 ENCOUNTER — Encounter: Payer: Self-pay | Admitting: Podiatry

## 2020-06-03 ENCOUNTER — Ambulatory Visit (INDEPENDENT_AMBULATORY_CARE_PROVIDER_SITE_OTHER): Payer: Federal, State, Local not specified - PPO | Admitting: Podiatry

## 2020-06-03 DIAGNOSIS — M2012 Hallux valgus (acquired), left foot: Secondary | ICD-10-CM

## 2020-06-03 DIAGNOSIS — B351 Tinea unguium: Secondary | ICD-10-CM

## 2020-06-03 DIAGNOSIS — M79674 Pain in right toe(s): Secondary | ICD-10-CM

## 2020-06-03 DIAGNOSIS — M2011 Hallux valgus (acquired), right foot: Secondary | ICD-10-CM

## 2020-06-03 DIAGNOSIS — M79675 Pain in left toe(s): Secondary | ICD-10-CM | POA: Diagnosis not present

## 2020-06-06 NOTE — Progress Notes (Signed)
Subjective: Todd Cain presents  for complaint of painful thick toenails that are difficult to trim. Pain interferes with ambulation. Aggravating factors include wearing enclosed shoe gear.   Wife states she used to cut her husband's toenails. She, herself, was in rehab facility for 2 months, leaving her son and husband at home. Husband is unable to cut his nails now and they are seeking professional services for this on today.  Past Medical History:  Diagnosis Date  . Cancer ([place])   . COPD (chronic obstructive pulmonary disease) ([place])   . HOH (hard of hearing) 04/20/2017  . Hypertension   . Memory disorder 04/20/2017     Patient Active Problem List   Diagnosis Date Noted  . Memory disorder 04/20/2017  . HOH (hard of hearing) 04/20/2017     Past Surgical History:  Procedure Laterality Date  . HERNIA REPAIR    . PROSTATE SURGERY       Current Outpatient Medications on File Prior to Visit  Medication Sig Dispense Refill  . amLODipine-benazepril (LOTREL) 5-10 MG per capsule Take 1 capsule by mouth daily.    Marland Kitchen aspirin 81 MG tablet Take 81 mg by mouth daily.     . benazepril (LOTENSIN) 10 MG tablet Take 10 mg by mouth daily.  3  . buPROPion (WELLBUTRIN XL) 300 MG 24 hr tablet Take 300 mg by mouth daily.    . carvedilol (COREG) 12.5 MG tablet Take 12.5 mg by mouth 2 (two) times daily with a meal.    . COMBIGAN 0.2-0.5 % ophthalmic solution INSTILL 1 DROP INTO BOTH EYES EVERY 12 HOURS  3  . donepezil (ARICEPT) 10 MG tablet Take 1 tablet (10 mg total) by mouth at bedtime. 30 tablet 3  . dorzolamide (TRUSOPT) 2 % ophthalmic solution INSTILL 1 DROP INTO RIGHT EYE 2 TIMES EVERY DAY  3  . escitalopram (LEXAPRO) 20 MG tablet Take 20 mg by mouth daily.    . fish oil-omega-3 fatty acids 1000 MG capsule Take 2 g by mouth daily.    Marland Kitchen ibuprofen (ADVIL,MOTRIN) 200 MG tablet Take 400 mg by mouth every 6 (six) hours as needed for pain.    Marland Kitchen latanoprost (XALATAN) 0.005 % ophthalmic solution  INSTILL 1 DROP BY OPHTHALMIC ROUTE EVERY DAY INTO BOTH EYES IN THE EVENING  5  . memantine (NAMENDA) 10 MG tablet TAKE 1 TABLET BY MOUTH TWICE A DAY 180 tablet 1  . omeprazole (PRILOSEC) 20 MG capsule Take 20 mg by mouth daily.    . pravastatin (PRAVACHOL) 40 MG tablet Take 40 mg by mouth daily.     No current facility-administered medications on file prior to visit.     No Known Allergies   Social History   Occupational History  . Occupation: Retired  Tobacco Use  . Smoking status: Former Smoker    Quit date: 11/29/2010    Years since quitting: 9.5  . Smokeless tobacco: Never Used  Vaping Use  . Vaping Use: Never used  Substance and Sexual Activity  . Alcohol use: No  . Drug use: No  . Sexual activity: Not on file     History reviewed. No pertinent family history.    There is no immunization history on file for this patient.   Objective: Todd Cain is a/an 79 y.o. male WD, WN in NAD.Marland Kitchen AAO x 3. There were no vitals filed for this visit.  Vascular Examination:  Capillary fill time to digits <3 seconds b/l lower extremities. Palpable DP pulse(s)  b/l lower extremities Faintly palpable PT pulse(s) b/l lower extremities. Pedal hair sparse. Lower extremity skin temperature gradient within normal limits. No pain with calf compression b/l.  Dermatological Examination: Pedal skin with normal turgor, texture and tone bilaterally. No open wounds bilaterally. No interdigital macerations bilaterally. Toenails 1-5 b/l elongated, discolored, dystrophic, thickened, crumbly with subungual debris and tenderness to dorsal palpation.  Musculoskeletal: Normal muscle strength 5/5 to all lower extremity muscle groups bilaterally. No pain crepitus or joint limitation noted with ROM b/l. Hallux valgus with bunion deformity noted b/l lower extremities.  Neurological: Protective sensation intact 5/5 intact bilaterally with 10g monofilament b/l. Vibratory sensation intact b/l.  Proprioception intact bilaterally. Deep tendon reflexes normal b/l.  Clonus positive b/l.  1. Pain due to onychomycosis of toenails of both feet   2. Hallux valgus, acquired, bilateral     Plan: -Examined patient. -Discussed onychomycosis and treatment options. Toenails 1-5 b/l were debrided in length and girth with sterile nail nippers and dremel without iatrogenic bleeding.  -Patient to report any pedal injuries to medical professional immediately. -Patient to continue soft, supportive shoe gear daily. -Patient/POA to call should there be question/concern in the interim.  Return in about 3 months (around 09/03/2020).

## 2020-07-11 ENCOUNTER — Other Ambulatory Visit: Payer: Self-pay | Admitting: Adult Health

## 2020-08-20 ENCOUNTER — Ambulatory Visit: Payer: Federal, State, Local not specified - PPO | Admitting: Adult Health

## 2020-09-07 ENCOUNTER — Ambulatory Visit: Payer: Federal, State, Local not specified - PPO | Admitting: Podiatry

## 2020-10-13 ENCOUNTER — Ambulatory Visit: Payer: Federal, State, Local not specified - PPO | Admitting: Adult Health

## 2020-10-14 ENCOUNTER — Ambulatory Visit: Payer: Federal, State, Local not specified - PPO | Admitting: Adult Health

## 2020-12-14 ENCOUNTER — Telehealth: Payer: Self-pay

## 2020-12-14 NOTE — Telephone Encounter (Signed)
I connected by phone with Todd Cain and/or patient's caregiver on 12/14/2020 at 10:15 AM to discuss the potential vaccination through our Homebound vaccination initiative.   Prevaccination Checklist for COVID-19 Vaccines  1.  Are you feeling sick today? no  2.  Have you ever received a dose of a COVID-19 vaccine?  no      If yes, which one? None   How many dose of Covid-19 vaccine have your received and dates ? none   Check all that apply: I live in a long-term care setting. no  I have been diagnosed with a medical condition(s). Please list: 80 yr old male unable to do any traveling. (pertinent to homebound status)  I am a first responder. no  I work in a long-term care facility, correctional facility, hospital, restaurant, retail setting, school, or other setting with high exposure to the public. no  4. Do you have a health condition or are you undergoing treatment that makes you moderately or severely immunocompromised? (This would include treatment for cancer or HIV, receipt of organ transplant, immunosuppressive therapy or high-dose corticosteroids, CAR-T-cell therapy, hematopoietic cell transplant [HCT], DiGeorge syndrome or Wiskott-Aldrich syndrome)  no  5. Have you received hematopoietic cell transplant (HCT) or CAR-T-cell therapies since receiving COVID-19 vaccine? no  6.  Have you ever had an allergic reaction: (This would include a severe reaction [ e.g., anaphylaxis] that required treatment with epinephrine or EpiPen or that caused you to go to the hospital.  It would also include an allergic reaction that occurred within 4 hours that caused hives, swelling, or respiratory distress, including wheezing.) A.  A previous dose of COVID-19 vaccine. no  B.  A vaccine or injectable therapy that contains multiple components, one of which is a COVID-19 vaccine component, but it is not known which component elicited the immediate reaction. no  C.  Are you allergic to polyethylene  glycol? no  D. Are you allergic to Polysorbate, which is found in some vaccines, film coated tablets and intravenous steroids?  no   7.  Have you ever had an allergic reaction to another vaccine (other than COVID-19 vaccine) or an injectable medication? (This would include a severe reaction [ e.g., anaphylaxis] that required treatment with epinephrine or EpiPen or that caused you to go to the hospital.  It would also include an allergic reaction that occurred within 4 hours that caused hives, swelling, or respiratory distress, including wheezing.)  no   8.  Have you ever had a severe allergic reaction (e.g., anaphylaxis) to something other than a component of the COVID-19 vaccine, or any vaccine or injectable medication?  This would include food, pet, venom, environmental, or oral medication allergies.  no   Check all that apply to you:  Am a male between ages 81 and 60 years old  no  Women 68 through 82 years of age can receive any FDA-authorized or -approved COVID-19 vaccine. However, they should be informed of the rare but increased risk of thrombosis with thrombocytopenia syndrome (TTS) after receipt of the [employer] Vaccine and the availability of other FDA-authorized and -approved COVID-19 vaccines. People who had TTS after a first dose of Janssen vaccine should not receive a subsequent dose of Janssen product    Am a male between ages 25 and 63 years old  no Males 5 through 82 years of age may receive the correct formulation of Pfizer-BioNTech COVID-19 vaccine. Males 18 and older can receive any FDA-authorized or -approved vaccine. However, people receiving  an mRNA COVID-19 vaccine, especially males 44 through 82 years of age and their parents/legal representative (when relevant), should be informed of the risk of developing myocarditis (an inflammation of the heart muscle) or pericarditis (inflammation of the lining around the heart) after receipt of an mRNA vaccine. The risk of  developing either myocarditis or pericarditis after vaccination is low, and lower than the risk of myocarditis associated with SARS-CoV-2 infection in adolescents and adults. Vaccine recipients should be counseled about the need to seek care if symptoms of myocarditis or pericarditis develop after vaccination     Have a history of myocarditis or pericarditis  no Myocarditis or pericarditis after receipt of the first dose of an mRNA COVID-19 vaccine series but before administration of the second dose  Experts advise that people who develop myocarditis or pericarditis after a dose of an mRNA COVID-19 vaccine not receive a subsequent dose of any COVID-19 vaccine, until additional safety data are available.  Administration of a subsequent dose of COVID-19 vaccine before safety data are available can be considered in certain circumstances after the episode of myocarditis or pericarditis has completely resolved. Until additional data are available, some experts recommend a Alphonsa Overall COVID-19 vaccine be considered instead of an mRNA COVID-19 vaccine. Decisions about proceeding with a subsequent dose should include a conversation between the patient, their parent/legal representative (when relevant), and their clinical team, which may include a cardiologist.    Have been treated with monoclonal antibodies or convalescent serum to prevent or treat COVID-19  no Vaccination should be offered to people regardless of history of prior symptomatic or asymptomatic SARS-CoV-2 infection. There is no recommended minimal interval between infection and vaccination.  However, vaccination should be deferred if a patient received monoclonal antibodies or convalescent serum as treatment for COVID-19 or for post-exposure prophylaxis. This is a precautionary measure until additional information becomes available, to avoid interference of the antibody treatment with vaccine-induced immune responses.  Defer COVID-19 vaccination for 30  days when a passive antibody product was used for post-exposure prophylaxis.  Defer COVID-19 vaccination for 90 days when a passive antibody product was used to treat COVID-19.     Diagnosed with Multisystem Inflammatory Syndrome (MIS-C or MIS-A) after a COVID-19 infection  no It is unknown if people with a history of MIS-C or MIS-A are at risk for a dysregulated immune response to COVID-19 vaccination.  People with a history of MIS-C or MIS-A may choose to be vaccinated. Considerations for vaccination may include:   Clinical recovery from MIS-C or MIS-A, including return to normal cardiac function   Personal risk of severe acute COVID-19 (e.g., age, underlying conditions)   High or substantial community transmission of SARS-CoV-2 and personal increased risk of reinfection.   Timing of any immunomodulatory therapies (general best practice guidelines for immunization can be consulted for more information [email or web address])   It has been 90 days or more since their diagnosis of MIS-C   Onset of MIS-C occurred before any COVID-19 vaccination   A conversation between the patient, their guardian(s), and their clinical team or a specialist may assist with COVID-19 vaccination decisions. Healthcare providers and health departments may also request a consultation from the [place] at [email or web address].     Have a bleeding disorder  no Take a blood thinner  no As with all vaccines, any COVID-19 vaccine product may be given to these patients, if a physician familiar with the patient's bleeding risk determines that the vaccine can be administered intramuscularly  with reasonable safety.  ACIP recommends the following technique for intramuscular vaccination in patients with bleeding disorders or taking blood thinners: a fine-gauge needle (23-gauge or smaller caliber)  should be used for the vaccination, followed by firm pressure on the site, without rubbing, for at least 2 minutes.  People who regularly take aspirin or anticoagulants as part of their routine medications do not need to stop these medications prior to receipt of any COVID-19 vaccine.    Have a history of heparin-induced thrombocytopenia (HIT)  no Although the etiology of TTS associated with the Alphonsa Overall COVID-19 vaccine is unclear, it appears to be similar to another rare immune-mediated syndrome, heparin-induced thrombocytopenia (HIT). People with a history of an episode of an immune-mediated syndrome characterized by thrombosis and thrombocytopenia, such as HIT, should be offered a currently FDA-approved or FDA-authorized mRNA COVID-19 vaccine if it has been ?90 days since their TTS resolved. After 90 days, patients may be vaccinated with any currently FDA-approved or FDA-authorized COVID-19 vaccine, including Janssen COVID-19 Vaccine. However, people who developed TTS after their initial Alphonsa Overall vaccine should not receive a Janssen booster dose.  Experts believe the following factors do not make people more susceptible to TTS after receipt of the [employer]. People with these conditions can be vaccinated with any FDA-authorized or - approved COVID-19 vaccine, including the [employer] COVID-19 Vaccine:   A prior history of venous thromboembolism   Risk factors for venous thromboembolism (e.g., inherited or acquired thrombophilia including Factor V Leiden; prothrombin gene 20210A mutation; antiphospholipid syndrome; protein C, protein S or antithrombin deficiency   A prior history of other types of thromboses not associated with thrombocytopenia   Pregnancy, post-partum status, or receipt of hormonal contraceptives (e.g., combined oral contraceptives, patch, ring)   Additional recipient education materials can be found at http://gutierrez-robinson.com/ vaccines/safety/JJUpdate.html.     Am currently pregnant or breastfeeding  no Vaccination is recommended for all people aged 58 years and older, including people that are:   Pregnant   Breastfeeding   Trying to get pregnant now or who might become pregnant in the future   Pregnant, breastfeeding, and post-partum people 32 through 82 years of age should be aware of the rare risk of TTS after receipt of the Alphonsa Overall COVID-19 Vaccine and the availability of other FDA-authorized or -approved COVID-19 vaccines (i.e., mRNA vaccines).    Have received dermal fillers  no FDA-authorized or -approved COVID-19 vaccines can be administered to people who have received injectable dermal fillers who have no contraindications for vaccination.  Infrequently, these people might experience temporary swelling at or near the site of filler injection (usually the face or lips) following administration of a dose of an mRNA COVID-19 vaccine. These people should be advised to contact their healthcare provider if swelling develops at or near the site of dermal filler following vaccination.     Have a history of Guillain-Barr Syndrome (GBS)  no People with a history of GBS can receive any FDA-authorized or -approved COVID-19 vaccine. However, given the possible association between the [employer] and an increased risk of GBS, a patient with a history of GBS and their clinical team should discuss the availability of mRNA vaccines to offer protection against COVID-19. The highest risk has been observed in men aged 34-64 years with symptoms of GBS beginning within 42 days after Alphonsa Overall COVID-19 vaccination.  People who had GBS after receiving Alphonsa Overall vaccine should be made aware of the option to receive an mRNA COVID-19 vaccine booster at least 2  months (8 weeks) after the Janssen dose. However, Alphonsa Overall vaccine may be used as a booster, particularly if GBS occurred more than 42 days after vaccination or was related to a non-vaccine factor. Prior to  booster vaccination, a conversation between the patient and their clinical team may assist with decisions about use of a COVID-19 booster dose, including the timing of administration     Postvaccination Observation Times for People without Contraindications to Covid 19 Vaccination.  30 minutes:  People with a history of: A contraindication to another type of COVID-19 vaccine product (i.e., mRNA or viral vector COVID-19 vaccines)   Immediate (within 4 hours of exposure) non-severe allergic reaction to a COVID-19 vaccine or injectable therapies   Anaphylaxis due to any cause   Immediate allergic reaction of any severity to a non-COVID-19 vaccine   15 minutes: All other people  This patient is a 81 y.o. male that meets the FDA criteria to receive homebound vaccination. Patient or parent/caregiver understands they have the option to accept or refuse homebound vaccination.  Patient passed the pre-screening checklist and would like to proceed with homebound vaccination.  Based on questionnaire above, I recommend the patient be observed for 15 minutes.  There are an estimated #0 other household members/caregivers who are also interested in receiving the vaccine.    The patient has been confirmed homebound and eligible for homebound vaccination with the considerations outlined above. I will send the patient's information to our scheduling team who will reach out to schedule the patient and potential caregiver/family members for homebound vaccination.    Dan Humphreys 12/14/2020 10:15 AM

## 2020-12-15 ENCOUNTER — Ambulatory Visit: Payer: Federal, State, Local not specified - PPO | Attending: Critical Care Medicine

## 2020-12-15 DIAGNOSIS — Z23 Encounter for immunization: Secondary | ICD-10-CM

## 2020-12-15 NOTE — Progress Notes (Signed)
   Covid-19 Vaccination Clinic  Name:  Todd Cain    MRN: [id number] DOB: 07/17/1941  12/15/2020  [name] was observed post Covid-19 immunization for 15 min without incident. He was provided with Vaccine Information Sheet and instruction to access the V-Safe system.   [name] was instructed to call 911 with any severe reactions post vaccine: Marland Kitchen Difficulty breathing  . Swelling of face and throat  . A fast heartbeat  . A bad rash all over body  . Dizziness and weakness   Immunizations Administered    Name Date Dose VIS Date Route   Moderna COVID-19 Vaccine 12/15/2020 11:32 AM 0.5 mL 08/26/2020 Intramuscular   Manufacturer: Moderna   Lot: [id number]   [place]: [phone]

## 2020-12-28 ENCOUNTER — Other Ambulatory Visit: Payer: Self-pay

## 2020-12-28 ENCOUNTER — Emergency Department (HOSPITAL_COMMUNITY): Payer: Medicare Other

## 2020-12-28 ENCOUNTER — Inpatient Hospital Stay (HOSPITAL_COMMUNITY)
Admission: EM | Admit: 2020-12-28 | Discharge: 2020-12-31 | DRG: 312 | Disposition: A | Discharge status: Home Health | Payer: Medicare Other | Attending: Internal Medicine | Admitting: Internal Medicine

## 2020-12-28 DIAGNOSIS — F0280 Dementia in other diseases classified elsewhere without behavioral disturbance: Secondary | ICD-10-CM | POA: Diagnosis not present

## 2020-12-28 DIAGNOSIS — F32A Depression, unspecified: Secondary | ICD-10-CM | POA: Diagnosis present

## 2020-12-28 DIAGNOSIS — W19XXXA Unspecified fall, initial encounter: Secondary | ICD-10-CM | POA: Diagnosis not present

## 2020-12-28 DIAGNOSIS — R55 Syncope and collapse: Principal | ICD-10-CM | POA: Diagnosis present

## 2020-12-28 DIAGNOSIS — Z6834 Body mass index (BMI) 34.0-34.9, adult: Secondary | ICD-10-CM

## 2020-12-28 DIAGNOSIS — G300 Alzheimer's disease with early onset: Secondary | ICD-10-CM | POA: Diagnosis not present

## 2020-12-28 DIAGNOSIS — Z8673 Personal history of transient ischemic attack (TIA), and cerebral infarction without residual deficits: Secondary | ICD-10-CM

## 2020-12-28 DIAGNOSIS — F0390 Unspecified dementia without behavioral disturbance: Secondary | ICD-10-CM | POA: Diagnosis present

## 2020-12-28 DIAGNOSIS — Z87891 Personal history of nicotine dependence: Secondary | ICD-10-CM

## 2020-12-28 DIAGNOSIS — Z20822 Contact with and (suspected) exposure to covid-19: Secondary | ICD-10-CM | POA: Diagnosis present

## 2020-12-28 DIAGNOSIS — K219 Gastro-esophageal reflux disease without esophagitis: Secondary | ICD-10-CM | POA: Diagnosis present

## 2020-12-28 DIAGNOSIS — E162 Hypoglycemia, unspecified: Secondary | ICD-10-CM | POA: Diagnosis present

## 2020-12-28 DIAGNOSIS — I10 Essential (primary) hypertension: Secondary | ICD-10-CM | POA: Diagnosis present

## 2020-12-28 DIAGNOSIS — H409 Unspecified glaucoma: Secondary | ICD-10-CM | POA: Diagnosis present

## 2020-12-28 DIAGNOSIS — H9110 Presbycusis, unspecified ear: Secondary | ICD-10-CM | POA: Diagnosis present

## 2020-12-28 DIAGNOSIS — E860 Dehydration: Secondary | ICD-10-CM | POA: Diagnosis present

## 2020-12-28 DIAGNOSIS — H9190 Unspecified hearing loss, unspecified ear: Secondary | ICD-10-CM | POA: Diagnosis present

## 2020-12-28 DIAGNOSIS — E785 Hyperlipidemia, unspecified: Secondary | ICD-10-CM | POA: Diagnosis present

## 2020-12-28 DIAGNOSIS — E669 Obesity, unspecified: Secondary | ICD-10-CM | POA: Diagnosis present

## 2020-12-28 DIAGNOSIS — Y92009 Unspecified place in unspecified non-institutional (private) residence as the place of occurrence of the external cause: Secondary | ICD-10-CM

## 2020-12-28 DIAGNOSIS — Z79899 Other long term (current) drug therapy: Secondary | ICD-10-CM

## 2020-12-28 DIAGNOSIS — R17 Unspecified jaundice: Secondary | ICD-10-CM | POA: Diagnosis present

## 2020-12-28 DIAGNOSIS — J449 Chronic obstructive pulmonary disease, unspecified: Secondary | ICD-10-CM | POA: Diagnosis present

## 2020-12-28 DIAGNOSIS — J4489 Other specified chronic obstructive pulmonary disease: Secondary | ICD-10-CM | POA: Diagnosis present

## 2020-12-28 DIAGNOSIS — I252 Old myocardial infarction: Secondary | ICD-10-CM

## 2020-12-28 DIAGNOSIS — D649 Anemia, unspecified: Secondary | ICD-10-CM | POA: Diagnosis present

## 2020-12-28 DIAGNOSIS — E876 Hypokalemia: Secondary | ICD-10-CM | POA: Diagnosis not present

## 2020-12-28 DIAGNOSIS — I452 Bifascicular block: Secondary | ICD-10-CM | POA: Diagnosis present

## 2020-12-28 LAB — COMPREHENSIVE METABOLIC PANEL
ALT: 16 U/L (ref 0–44)
AST: 18 U/L (ref 15–41)
Albumin: 3.7 g/dL (ref 3.5–5.0)
Alkaline Phosphatase: 84 U/L (ref 38–126)
Anion gap: 10 (ref 5–15)
BUN: 10 mg/dL (ref 8–23)
CO2: 24 mmol/L (ref 22–32)
Calcium: 9.1 mg/dL (ref 8.9–10.3)
Chloride: 106 mmol/L (ref 98–111)
Creatinine, Ser: 1.41 mg/dL — ABNORMAL HIGH (ref 0.61–1.24)
GFR, Estimated: 50 mL/min — ABNORMAL LOW (ref >60)
Glucose, Bld: 73 mg/dL (ref 70–99)
Potassium: 3.9 mmol/L (ref 3.5–5.1)
Sodium: 140 mmol/L (ref 135–145)
Total Bilirubin: 1.3 mg/dL — ABNORMAL HIGH (ref 0.3–1.2)
Total Protein: 6.3 g/dL — ABNORMAL LOW (ref 6.5–8.1)

## 2020-12-28 LAB — CBC WITH DIFFERENTIAL/PLATELET
Abs Immature Granulocytes: 0.04 10*3/uL (ref 0.00–0.07)
Basophils Absolute: 0.1 10*3/uL (ref 0.0–0.1)
Basophils Relative: 1 %
Eosinophils Absolute: 0.2 10*3/uL (ref 0.0–0.5)
Eosinophils Relative: 2 %
HCT: 49.9 % (ref 39.0–52.0)
Hemoglobin: 16.5 g/dL (ref 13.0–17.0)
Immature Granulocytes: 0 %
Lymphocytes Relative: 16 %
Lymphs Abs: 1.8 10*3/uL (ref 0.7–4.0)
MCH: 31.5 pg (ref 26.0–34.0)
MCHC: 33.1 g/dL (ref 30.0–36.0)
MCV: 95.2 fL (ref 80.0–100.0)
Monocytes Absolute: 1.1 10*3/uL — ABNORMAL HIGH (ref 0.1–1.0)
Monocytes Relative: 9 %
Neutro Abs: 8 10*3/uL — ABNORMAL HIGH (ref 1.7–7.7)
Neutrophils Relative %: 72 %
Platelets: 254 10*3/uL (ref 150–400)
RBC: 5.24 MIL/uL (ref 4.22–5.81)
RDW: 12.4 % (ref 11.5–15.5)
WBC: 11.2 10*3/uL — ABNORMAL HIGH (ref 4.0–10.5)
nRBC: 0 % (ref 0.0–0.2)

## 2020-12-28 LAB — TROPONIN I (HIGH SENSITIVITY)
Troponin I (High Sensitivity): 4 ng/L (ref <18)
Troponin I (High Sensitivity): 4 ng/L (ref <18)

## 2020-12-28 LAB — URINALYSIS, ROUTINE W REFLEX MICROSCOPIC
Bilirubin Urine: NEGATIVE
Glucose, UA: NEGATIVE mg/dL
Hgb urine dipstick: NEGATIVE
Ketones, ur: NEGATIVE mg/dL
Leukocytes,Ua: NEGATIVE
Nitrite: NEGATIVE
Protein, ur: NEGATIVE mg/dL
Specific Gravity, Urine: 1.011 (ref 1.005–1.030)
pH: 7 (ref 5.0–8.0)

## 2020-12-28 NOTE — ED Notes (Signed)
Pt is ambulatory with maximum assistance and has an unsteady gait

## 2020-12-28 NOTE — H&P (Signed)
History and Physical    Todd Cain:[id number] DOB: January 14, 1941 DOA: 12/28/2020  PCP: Sigmund Hazel, MD   Patient coming from: Home  I have personally briefly reviewed patient's old medical records in [facility]  Chief Complaint: Fall at home   HPI: Todd Cain is a 80 y.o. male with dementia, HTN, HLD, GERD and COPD was brought by EMS post fall.   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  "States he passed out twice while he was standing.  States he was told coming to the ER by his doctor.  Very difficult to get history from him because he cannot hear very well.  Does not appear tender anywhere.  Cannot tell me if he felt lightheaded or dizzy before the fall.  Cannot tell me if there is chest pain. "   Apparently lives with wife.  Per documentation he is able to complete most ADLs independently with some prompting.  Per wife he sleeps often during the day.  He continues on Aricept and Namenda.    ED Course:  165/94, pulse 84, RR 20 O2 sat 93% room air T-max 99.2  -Sodium 140, potassium 3.9, BUN 10, creatinine 1.4 -Troponin I x2 - -WBC 11-hemoglobin 16, platelets 254K UA negative for UTI  -Head CT with chronic appearing right basal ganglia infarct.-CXR without active cardiopulmonary disease  -EKG with NSR at 92 bpm, left axis deviation, left anterior fascicular block, incomplete right bundle branch block.  Anteroseptal infarct.  -Given his age and unclear history, ED decided to admit for syncope.  Review of Systems: As per HPI otherwise 10 point review of systems negative.    Past Medical History:  Diagnosis Date  . Cancer (HCC)   . COPD (chronic obstructive pulmonary disease) (HCC)   . HOH (hard of hearing) 04/20/2017  . Hypertension   . Memory disorder 04/20/2017    Past Surgical History:  Procedure Laterality Date  . HERNIA REPAIR    . PROSTATE SURGERY       reports that he quit smoking about 10  years ago. He has never used smokeless tobacco. He reports that he does not drink alcohol and does not use drugs.  No Known Allergies  No family history on file.   Prior to Admission medications   Medication Sig Start Date End Date Taking? Authorizing Provider  benazepril (LOTENSIN) 10 MG tablet Take 10 mg by mouth daily. 02/23/17  Yes [provider]  buPROPion (WELLBUTRIN XL) 300 MG 24 hr tablet Take 300 mg by mouth daily.   Yes [provider]  carvedilol (COREG) 12.5 MG tablet Take 12.5 mg by mouth 2 (two) times daily with a meal.   Yes [provider]  donepezil (ARICEPT) 10 MG tablet Take 1 tablet (10 mg total) by mouth at bedtime. 05/01/17  Yes York Spaniel, MD  dorzolamide-timolol (COSOPT) 22.3-6.8 MG/ML ophthalmic solution Place 1 drop into both eyes daily. 09/28/20  Yes [provider]  escitalopram (LEXAPRO) 20 MG tablet Take 20 mg by mouth daily. 12/19/18  Yes [provider]  latanoprost (XALATAN) 0.005 % ophthalmic solution Place 1 drop into both eyes at bedtime. 04/17/17  Yes [provider]  levocetirizine (XYZAL) 5 MG tablet Take 5 mg by mouth daily as needed for allergies.   Yes [provider]  memantine (NAMENDA) 10 MG tablet TAKE 1 TABLET BY MOUTH TWICE A DAY Patient taking differently: Take 10 mg by mouth 2 (two) times daily. 07/14/20  Yes Butch Penny, NP  Multiple Vitamin (MULTIVITAMIN WITH MINERALS) TABS tablet Take 1 tablet by mouth daily.   Yes [provider]  omeprazole (PRILOSEC) 20 MG capsule Take 20 mg by mouth daily.   Yes [provider]  pravastatin (PRAVACHOL) 40 MG tablet Take 40 mg by mouth daily.   Yes [provider]  VITAMIN D PO Take 1 capsule by mouth daily.   Yes [provider]    Physical Exam: Vitals:   12/28/20 1820 12/28/20 1830 12/28/20 1946  BP:  (!) 166/101 (!) 165/94  Pulse:  84 92  Resp:   20  Temp:  99.1 F (37.3 C) 99.2 F (37.3  C)  TempSrc:  Oral Rectal  SpO2: 97% 96% 93%    Constitutional: NAD, calm, comfortable Presbycusis.  Oriented x1. Vitals:   12/28/20 1820 12/28/20 1830 12/28/20 1946  BP:  (!) 166/101 (!) 165/94  Pulse:  84 92  Resp:   20  Temp:  99.1 F (37.3 C) 99.2 F (37.3 C)  TempSrc:  Oral Rectal  SpO2: 97% 96% 93%   Eyes: PERRL, lids and conjunctivae normal ENMT: Mucous membranes are moist. Posterior pharynx clear of any exudate or lesions.Normal dentition.  Neck: normal, supple, no masses, no thyromegaly No bruit no JVD or carotid bruit.  Respiratory: clear to auscultation bilaterally, no wheezing, no crackles. Normal respiratory effort. No accessory muscle use.  Cardiovascular: Regular rate and rhythm, no murmurs / rubs / gallops. No extremity edema. 2+ pedal pulses. No carotid bruits.  Abdomen: no tenderness, no masses palpated. No hepatosplenomegaly. Bowel sounds positive.  Musculoskeletal: no clubbing / cyanosis. No joint deformity upper and lower extremities. Good ROM, no contractures. Normal muscle tone.  Skin: no rashes, lesions, ulcers. No induration Neurologic: CN 2-12 grossly intact. Sensation intact, DTR normal. Strength 5/5 in all 4.  Psychiatric: Normal judgment and insight. Alert and oriented x 3. Normal mood.    Labs on Admission: I have personally reviewed following labs and imaging studies  CBC: Recent Labs  Lab 12/28/20 1933  WBC 11.2*  NEUTROABS 8.0*  HGB 16.5  HCT 49.9  MCV 95.2  PLT 254   Basic Metabolic Panel: Recent Labs  Lab 12/28/20 1933  NA 140  K 3.9  CL 106  CO2 24  GLUCOSE 73  BUN 10  CREATININE 1.41*  CALCIUM 9.1   GFR: CrCl cannot be calculated (Unknown ideal weight.). Liver Function Tests: Recent Labs  Lab 12/28/20 1933  AST 18  ALT 16  ALKPHOS 84  BILITOT 1.3*  PROT 6.3*  ALBUMIN 3.7   Urine analysis:    Component Value Date/Time   COLORURINE YELLOW 12/28/2020 1848   APPEARANCEUR CLEAR 12/28/2020 1848   LABSPEC 1.011  12/28/2020 1848   PHURINE 7.0 12/28/2020 1848   GLUCOSEU NEGATIVE 12/28/2020 1848   HGBUR NEGATIVE 12/28/2020 1848   BILIRUBINUR NEGATIVE 12/28/2020 1848   KETONESUR NEGATIVE 12/28/2020 1848   PROTEINUR NEGATIVE 12/28/2020 1848   UROBILINOGEN 0.2 01/28/2013 1615   NITRITE NEGATIVE 12/28/2020 1848   LEUKOCYTESUR NEGATIVE 12/28/2020 1848    Radiological Exams on Admission: DG Chest 2 View  Result Date: 12/28/2020 CLINICAL DATA:  80 year old male with fall.  Shortness of breath. EXAM: CHEST - 2 VIEW COMPARISON:  None FINDINGS: There is chronic interstitial coarsening. No focal consolidation, pleural effusion, pneumothorax. Mild eventration of the right hemidiaphragm. There is mild cardiomegaly. Atherosclerotic calcification of the aortic arch. No acute osseous pathology. IMPRESSION: No active cardiopulmonary disease. Electronically Signed  By: Elgie Collard M.D.   On: 12/28/2020 22:40   CT Head Wo Contrast  Result Date: 12/28/2020 CLINICAL DATA:  Dizziness fall EXAM: CT HEAD WITHOUT CONTRAST TECHNIQUE: Contiguous axial images were obtained from the base of the skull through the vertex without intravenous contrast. COMPARISON:  MRI 05/03/2017 FINDINGS: Brain: No acute territorial infarction, hemorrhage, or intracranial mass. Advanced hypodensity in the white matter consistent with chronic small vessel ischemic change. Advanced atrophy. Chronic appearing right basal ganglial infarct. Nonenlarged ventricles. Vascular: No hyperdense vessels. Vertebral and carotid vascular calcification Skull: Normal. Negative for fracture or focal lesion. Sinuses/Orbits: Mucosal thickening in the sinuses Other: None IMPRESSION: 1. No CT evidence for acute intracranial abnormality. 2. Atrophy and chronic small vessel ischemic changes of the white matter. Chronic appearing right basal ganglial infarct. Electronically Signed   By: Jasmine Pang M.D.   On: 12/28/2020 22:52    EKG: Independently reviewed.  NSR at 92  bpm, axis deviation, left anterior fascicular block.  Anteroseptal infarct.  Assessment/Plan Active Problems:   Fall at home, initial encounter   GERD (gastroesophageal reflux disease)   COPD with chronic bronchitis (HCC)   Dementia without behavioral disturbance (HCC)   Presbyacusia   Syncope  #Fall at home #Suspected syncope -Will admit patient and monitor on telemetry.  Consider TTE in a.m.  If negative can consider discharge with Zio patch given his age and poor history. -PT OT.  #HTN -Continue with Coreg and benazepril.   #HLD -Continue with pravastatin.   #Dementia -Continue with Aricept and Namenda.   DVT prophylaxis: SubQheparin.    Code Status: Full code Family Communication: None.    Disposition Plan: SNF versus home PT.    Consults called: None. Admission status: Telemetry observation.   Joanathan Affeldt MD [facility]   If 7PM-7AM, please contact night-coverage [email or web address] Password [facility]  12/28/2020, 11:57 PM

## 2020-12-28 NOTE — ED Notes (Signed)
Placed external male condom catheter on patient Patient wears hearing aids and is hard of hearing Urine specimen obtained already and sent to lab

## 2020-12-28 NOTE — ED Notes (Signed)
Copied from note by Lynford Humphrey, RN. Pt had 2 falls from standing position today, denies injuries, denies pain, generalized weakness and foul smelling urine.

## 2020-12-28 NOTE — ED Notes (Signed)
Patient transported to CT 

## 2020-12-28 NOTE — ED Provider Notes (Signed)
Report [facility] DEPT Provider Note   CSN: [id number] Arrival date & time: 12/28/20  1811     History Chief Complaint  Patient presents with  . Fall    2 falls from standing position today, denies injuries, denies pain - reports generalized weakness and foul smelling urine     Todd Cain is a 80 y.o. male. Level 5 caveat due to difficulty hearing and some memory disorder. HPI Patient presents after syncopal episode.  States he passed out twice while he was standing.  States he was told coming to the ER by his doctor.  Very difficult to get history from him because he cannot hear very well.  Does not appear tender anywhere.  Cannot tell me if he felt lightheaded or dizzy before the fall.  Cannot tell me if there is chest pain.  Per nurses note may have had some foul-smelling urine.    Past Medical History:  Diagnosis Date  . Cancer ([place])   . COPD (chronic obstructive pulmonary disease) ([place])   . HOH (hard of hearing) 04/20/2017  . Hypertension   . Memory disorder 04/20/2017    Patient Active Problem List   Diagnosis Date Noted  . Memory disorder 04/20/2017  . HOH (hard of hearing) 04/20/2017    Past Surgical History:  Procedure Laterality Date  . HERNIA REPAIR    . PROSTATE SURGERY         No family history on file.  Social History   Tobacco Use  . Smoking status: Former Smoker    Quit date: 11/29/2010    Years since quitting: 10.0  . Smokeless tobacco: Never Used  Vaping Use  . Vaping Use: Never used  Substance Use Topics  . Alcohol use: No  . Drug use: No    Home Medications Prior to Admission medications   Medication Sig Start Date End Date Taking? Authorizing Provider  benazepril (LOTENSIN) 10 MG tablet Take 10 mg by mouth daily. 02/23/17  Yes [provider]  buPROPion (WELLBUTRIN XL) 300 MG 24 hr tablet Take 300 mg by mouth daily.   Yes [provider]  carvedilol (COREG) 12.5 MG tablet Take  12.5 mg by mouth 2 (two) times daily with a meal.   Yes [provider]  donepezil (ARICEPT) 10 MG tablet Take 1 tablet (10 mg total) by mouth at bedtime. 05/01/17  Yes Kathrynn Ducking, MD  dorzolamide-timolol (COSOPT) 22.3-6.8 MG/ML ophthalmic solution Place 1 drop into both eyes daily. 09/28/20  Yes [provider]  escitalopram (LEXAPRO) 20 MG tablet Take 20 mg by mouth daily. 12/19/18  Yes [provider]  latanoprost (XALATAN) 0.005 % ophthalmic solution Place 1 drop into both eyes at bedtime. 04/17/17  Yes [provider]  levocetirizine (XYZAL) 5 MG tablet Take 5 mg by mouth daily as needed for allergies.   Yes [provider]  memantine (NAMENDA) 10 MG tablet TAKE 1 TABLET BY MOUTH TWICE A DAY Patient taking differently: Take 10 mg by mouth 2 (two) times daily. 07/14/20  Yes Ward Givens, NP  Multiple Vitamin (MULTIVITAMIN WITH MINERALS) TABS tablet Take 1 tablet by mouth daily.   Yes [provider]  omeprazole (PRILOSEC) 20 MG capsule Take 20 mg by mouth daily.   Yes [provider]  pravastatin (PRAVACHOL) 40 MG tablet Take 40 mg by mouth daily.   Yes [provider]  VITAMIN D PO Take 1 capsule by mouth daily.   Yes [provider]  Allergies    Patient has no known allergies.  Review of Systems   Review of Systems  Physical Exam Updated Vital Signs BP (!) 165/94 (BP Location: Right Arm)   Pulse 92   Temp 99.2 F (37.3 C) (Rectal)   Resp 20   SpO2 93%   Physical Exam  ED Results / Procedures / Treatments   Labs (all labs ordered are listed, but only abnormal results are displayed) Labs Reviewed  COMPREHENSIVE METABOLIC PANEL - Abnormal; Notable for the following components:      Result Value   Creatinine, Ser 1.41 (*)    Total Protein 6.3 (*)    Total Bilirubin 1.3 (*)    GFR, Estimated 50 (*)    All other components within normal limits  CBC WITH DIFFERENTIAL/PLATELET - Abnormal;  Notable for the following components:   WBC 11.2 (*)    Neutro Abs 8.0 (*)    Monocytes Absolute 1.1 (*)    All other components within normal limits  URINALYSIS, ROUTINE W REFLEX MICROSCOPIC  TROPONIN I (HIGH SENSITIVITY)  TROPONIN I (HIGH SENSITIVITY)    EKG EKG Interpretation  Date/Time:  Monday December 28 2020 19:40:43 EST Ventricular Rate:  92 PR Interval:  196 QRS Duration: 96 QT Interval:  390 QTC Calculation: 482 R Axis:   -67 Text Interpretation: Normal sinus rhythm Left axis deviation Incomplete right bundle branch block Anteroseptal infarct , age undetermined Abnormal ECG No old tracing to compare Confirmed by Davonna Belling [id number]) on 12/28/2020 8:00:31 PM   Radiology DG Chest 2 View  Result Date: 12/28/2020 CLINICAL DATA:  80 year old male with fall.  Shortness of breath. EXAM: CHEST - 2 VIEW COMPARISON:  None FINDINGS: There is chronic interstitial coarsening. No focal consolidation, pleural effusion, pneumothorax. Mild eventration of the right hemidiaphragm. There is mild cardiomegaly. Atherosclerotic calcification of the aortic arch. No acute osseous pathology. IMPRESSION: No active cardiopulmonary disease. Electronically Signed   By: Anner Crete M.D.   On: 12/28/2020 22:40    Procedures Procedures   Medications Ordered in ED Medications - No data to display  ED Course  I have reviewed the triage vital signs and the nursing notes.  Pertinent labs & imaging results that were available during my care of the patient were reviewed by me and considered in my medical decision making (see chart for details).    MDM Rules/Calculators/A&P                          Patient presents with syncopal episode.  He has had 2 falls.  Patient is very hard of hearing and difficult to get any history from.  But it sounds that there was a loss of consciousness that made him fall.  Unsteady with ambulation.  EKG shows a right bundle branch block.  No old EKG.  Lab work  overall reassuring.  Troponin negative.  However with unsteadiness with walking that may be new and syncopal episode I feels the patient would benefit from admission to the hospital.  Will discuss with hospitalist. Final Clinical Impression(s) / ED Diagnoses Final diagnoses:  Syncope, unspecified syncope type    Rx / DC Orders ED Discharge Orders    None       Davonna Belling, MD 12/28/20 2335

## 2020-12-29 ENCOUNTER — Encounter (HOSPITAL_COMMUNITY): Payer: Self-pay | Admitting: Internal Medicine

## 2020-12-29 ENCOUNTER — Other Ambulatory Visit (HOSPITAL_COMMUNITY): Payer: Federal, State, Local not specified - PPO

## 2020-12-29 ENCOUNTER — Other Ambulatory Visit: Payer: Self-pay

## 2020-12-29 DIAGNOSIS — K219 Gastro-esophageal reflux disease without esophagitis: Secondary | ICD-10-CM | POA: Diagnosis not present

## 2020-12-29 DIAGNOSIS — R55 Syncope and collapse: Principal | ICD-10-CM

## 2020-12-29 DIAGNOSIS — H9110 Presbycusis, unspecified ear: Secondary | ICD-10-CM | POA: Diagnosis not present

## 2020-12-29 DIAGNOSIS — J449 Chronic obstructive pulmonary disease, unspecified: Secondary | ICD-10-CM | POA: Diagnosis present

## 2020-12-29 DIAGNOSIS — F0390 Unspecified dementia without behavioral disturbance: Secondary | ICD-10-CM | POA: Diagnosis present

## 2020-12-29 LAB — COMPREHENSIVE METABOLIC PANEL
ALT: 16 U/L (ref 0–44)
AST: 20 U/L (ref 15–41)
Albumin: 3.5 g/dL (ref 3.5–5.0)
Alkaline Phosphatase: 84 U/L (ref 38–126)
Anion gap: 7 (ref 5–15)
BUN: 12 mg/dL (ref 8–23)
CO2: 24 mmol/L (ref 22–32)
Calcium: 8.9 mg/dL (ref 8.9–10.3)
Chloride: 108 mmol/L (ref 98–111)
Creatinine, Ser: 1.26 mg/dL — ABNORMAL HIGH (ref 0.61–1.24)
GFR, Estimated: 57 mL/min — ABNORMAL LOW (ref >60)
Glucose, Bld: 66 mg/dL — ABNORMAL LOW (ref 70–99)
Potassium: 3.4 mmol/L — ABNORMAL LOW (ref 3.5–5.1)
Sodium: 139 mmol/L (ref 135–145)
Total Bilirubin: 1.6 mg/dL — ABNORMAL HIGH (ref 0.3–1.2)
Total Protein: 6.2 g/dL — ABNORMAL LOW (ref 6.5–8.1)

## 2020-12-29 LAB — HEPATIC FUNCTION PANEL
ALT: 18 U/L (ref 0–44)
AST: 18 U/L (ref 15–41)
Albumin: 3.8 g/dL (ref 3.5–5.0)
Alkaline Phosphatase: 86 U/L (ref 38–126)
Bilirubin, Direct: 0.2 mg/dL (ref 0.0–0.2)
Indirect Bilirubin: 1.3 mg/dL — ABNORMAL HIGH (ref 0.3–0.9)
Total Bilirubin: 1.5 mg/dL — ABNORMAL HIGH (ref 0.3–1.2)
Total Protein: 6 g/dL — ABNORMAL LOW (ref 6.5–8.1)

## 2020-12-29 LAB — CBC
HCT: 36.4 % — ABNORMAL LOW (ref 39.0–52.0)
Hemoglobin: 12 g/dL — ABNORMAL LOW (ref 13.0–17.0)
MCH: 31.9 pg (ref 26.0–34.0)
MCHC: 33 g/dL (ref 30.0–36.0)
MCV: 96.8 fL (ref 80.0–100.0)
Platelets: 164 10*3/uL (ref 150–400)
RBC: 3.76 MIL/uL — ABNORMAL LOW (ref 4.22–5.81)
RDW: 12.3 % (ref 11.5–15.5)
WBC: 8.1 10*3/uL (ref 4.0–10.5)
nRBC: 0 % (ref 0.0–0.2)

## 2020-12-29 LAB — BRAIN NATRIURETIC PEPTIDE: B Natriuretic Peptide: 39.6 pg/mL (ref 0.0–100.0)

## 2020-12-29 LAB — CBG MONITORING, ED: Glucose-Capillary: 76 mg/dL (ref 70–99)

## 2020-12-29 LAB — TSH: TSH: 1.199 u[IU]/mL (ref 0.350–4.500)

## 2020-12-29 LAB — HEMOGLOBIN A1C
Hgb A1c MFr Bld: 5.4 % (ref 4.8–5.6)
Mean Plasma Glucose: 108.28 mg/dL

## 2020-12-29 LAB — SARS CORONAVIRUS 2 (TAT 6-24 HRS): SARS Coronavirus 2: NEGATIVE

## 2020-12-29 MED ORDER — DORZOLAMIDE HCL-TIMOLOL MAL 2-0.5 % OP SOLN
1.0000 [drp] | Freq: Every day | OPHTHALMIC | Status: DC
  Administered 2020-12-29 – 2020-12-31 (×3): 1 [drp] via OPHTHALMIC
  Filled 2020-12-29 (×2): qty 10

## 2020-12-29 MED ORDER — ACETAMINOPHEN 650 MG RE SUPP: 650.0000 mg | Freq: Four times a day (QID) | RECTAL | Status: DC | PRN

## 2020-12-29 MED ORDER — HYDRALAZINE HCL 25 MG PO TABS
25.0000 mg | ORAL_TABLET | Freq: Three times a day (TID) | ORAL | Status: DC | PRN
Start: 2020-12-29 — End: 2020-12-31

## 2020-12-29 MED ORDER — QUETIAPINE FUMARATE 25 MG PO TABS: 12.5000 mg | ORAL_TABLET | Freq: Once | ORAL | Status: DC

## 2020-12-29 MED ORDER — ACETAMINOPHEN 325 MG PO TABS: 650.0000 mg | ORAL_TABLET | Freq: Four times a day (QID) | ORAL | Status: DC | PRN

## 2020-12-29 MED ORDER — MEMANTINE HCL 10 MG PO TABS
10.0000 mg | ORAL_TABLET | Freq: Two times a day (BID) | ORAL | Status: DC
  Administered 2020-12-29 – 2020-12-31 (×6): 10 mg via ORAL
  Filled 2020-12-29: qty 1
  Filled 2020-12-29: qty 2
  Filled 2020-12-29 (×2): qty 1
  Filled 2020-12-29: qty 2
  Filled 2020-12-29: qty 1

## 2020-12-29 MED ORDER — POTASSIUM CHLORIDE CRYS ER 20 MEQ PO TBCR
40.0000 meq | EXTENDED_RELEASE_TABLET | Freq: Once | ORAL | Status: AC
  Administered 2020-12-29: 40 meq via ORAL
  Filled 2020-12-29: qty 2

## 2020-12-29 MED ORDER — DONEPEZIL HCL 10 MG PO TABS
10.0000 mg | ORAL_TABLET | Freq: Every day | ORAL | Status: DC
  Administered 2020-12-29 – 2020-12-30 (×2): 10 mg via ORAL
  Filled 2020-12-29 (×2): qty 1

## 2020-12-29 MED ORDER — CARVEDILOL 12.5 MG PO TABS
12.5000 mg | ORAL_TABLET | Freq: Two times a day (BID) | ORAL | Status: DC
  Administered 2020-12-29 – 2020-12-31 (×5): 12.5 mg via ORAL
  Filled 2020-12-29 (×5): qty 1

## 2020-12-29 MED ORDER — SODIUM CHLORIDE 0.9% FLUSH
3.0000 mL | Freq: Two times a day (BID) | INTRAVENOUS | Status: DC
  Administered 2020-12-29 – 2020-12-31 (×5): 3 mL via INTRAVENOUS

## 2020-12-29 MED ORDER — HEPARIN SODIUM (PORCINE) 5000 UNIT/ML IJ SOLN
5000.0000 [IU] | Freq: Three times a day (TID) | INTRAMUSCULAR | Status: DC
  Administered 2020-12-29 – 2020-12-31 (×7): 5000 [IU] via SUBCUTANEOUS
  Filled 2020-12-29 (×7): qty 1

## 2020-12-29 MED ORDER — BUPROPION HCL ER (XL) 300 MG PO TB24
300.0000 mg | ORAL_TABLET | Freq: Every day | ORAL | Status: DC
  Administered 2020-12-29 – 2020-12-31 (×3): 300 mg via ORAL
  Filled 2020-12-29: qty 1
  Filled 2020-12-29: qty 2
  Filled 2020-12-29: qty 1

## 2020-12-29 MED ORDER — LEVOCETIRIZINE DIHYDROCHLORIDE 5 MG PO TABS: 5.0000 mg | ORAL_TABLET | Freq: Every day | ORAL | Status: DC | PRN

## 2020-12-29 MED ORDER — HALOPERIDOL LACTATE 5 MG/ML IJ SOLN
1.0000 mg | Freq: Once | INTRAMUSCULAR | Status: AC
  Administered 2020-12-29: 1 mg via INTRAVENOUS
  Filled 2020-12-29: qty 1

## 2020-12-29 MED ORDER — BENAZEPRIL HCL 10 MG PO TABS
10.0000 mg | ORAL_TABLET | Freq: Every day | ORAL | Status: DC
  Administered 2020-12-29 – 2020-12-31 (×3): 10 mg via ORAL
  Filled 2020-12-29 (×3): qty 1

## 2020-12-29 MED ORDER — LORATADINE 10 MG PO TABS: 10.0000 mg | ORAL_TABLET | Freq: Every day | ORAL | Status: DC | PRN

## 2020-12-29 MED ORDER — PRAVASTATIN SODIUM 40 MG PO TABS
40.0000 mg | ORAL_TABLET | Freq: Every day | ORAL | Status: DC
  Administered 2020-12-29 – 2020-12-31 (×3): 40 mg via ORAL
  Filled 2020-12-29: qty 1
  Filled 2020-12-29: qty 2
  Filled 2020-12-29: qty 1

## 2020-12-29 MED ORDER — PANTOPRAZOLE SODIUM 40 MG PO TBEC
40.0000 mg | DELAYED_RELEASE_TABLET | Freq: Every day | ORAL | Status: DC
  Administered 2020-12-29 – 2020-12-31 (×3): 40 mg via ORAL
  Filled 2020-12-29 (×3): qty 1

## 2020-12-29 MED ORDER — ESCITALOPRAM OXALATE 20 MG PO TABS
20.0000 mg | ORAL_TABLET | Freq: Every day | ORAL | Status: DC
  Administered 2020-12-29 – 2020-12-31 (×3): 20 mg via ORAL
  Filled 2020-12-29: qty 1
  Filled 2020-12-29: qty 2
  Filled 2020-12-29: qty 1

## 2020-12-29 MED ORDER — LATANOPROST 0.005 % OP SOLN
1.0000 [drp] | Freq: Every day | OPHTHALMIC | Status: DC
  Filled 2020-12-29 (×2): qty 2.5

## 2020-12-29 NOTE — ED Notes (Signed)
Hold transport at this time.

## 2020-12-29 NOTE — ED Notes (Signed)
Pt resting in bed at this time. NAD noted °

## 2020-12-29 NOTE — Progress Notes (Addendum)
PROGRESS NOTE    Todd Cain  SHF:[id number] DOB: 1941/05/16 DOA: 12/28/2020 PCP: Kathyrn Lass, MD    Brief Narrative:  Todd Cain is a 80 year old male with past medical history significant for advanced dementia, essential hypertension, hyperlipidemia, GERD and COPD who is brought to the ED via EMS following fall at home.  Given his underlying dementia and no family present, history obtained from EMS notes and ED report.  Apparently, patient passed out twice while he was standing and was told to come to the ED by his doctor.  Patient lives at home with his wife, apparently he is able to perform most ADLs independently with some prompting.  Wife reports that he often sleeps during the day and currently is on Aricept and Namenda.  In the ED, temperature nine 9.1, HR 84, RR 20, BP 166/101, SPO2 96% on room air.  Sodium 140, potassium 3.9, chloride 106, CO2 24, glucose 73, BUN 10, creatinine 1.41, AST 18, ALT 16, total bilirubin 1.3.  Troponin 4>4.  WBC 11.2, hemoglobin 16.5, platelets 254.  Urinalysis with negative leukocytes, negative nitrite.  CT head without contrast with no evidence for acute intracranial abnormality, atrophy with chronic small vessel ischemic changes of the white matter and chronic appearing right basal ganglia infarct.  Chest x-ray with no acute cardiopulmonary disease process.  EKG personally reviewed with normal sinus rhythm, incomplete right bundle branch block, left axis deviation, normal QTC and no concerning ST elevation/depressions or T wave inversions.  Patient referred for admission by EDP for further evaluation and management of syncopal episode in the setting of advanced dementia.   Assessment & Plan:   Active Problems:   Fall at home, initial encounter   GERD (gastroesophageal reflux disease)   COPD with chronic bronchitis ([place])   Dementia without behavioral disturbance ([place])   Presbyacusia   Syncope   Presyncopal syncopal episode with fall at  home Patient presenting to ED following reported fall x2 at home.  Unclear if loss of consciousness.  Patient is afebrile without leukocytosis, urinalysis unrevealing, CT head without acute findings. Chest x-ray negative.  TSH within normal limits.  Electrolytes within normal limits.  Troponin negative.  Spouse denies loss of consciousness, no seizure-like activity.  Suspect dehydration versus vasovagal episode versus vertigo in the setting of poor fluid intake outpatient as patient only ingest sweet tea for hydration per wife. --Orthostatic vital signs: Pending --TTE: Pending --PT/OT evaluation --Continue to monitor on telemetry for arrhythmia --Supportive care  Hypokalemia Potassium 3.4, will replete. --Repeat electrolytes in a.m. to include magnesium  Essential hypertension BP 166/85. --Continue carvedilol 12.5 mg p.o. twice daily --Continue benazepril 10 mg p.o. daily --Continue monitor BP closely, may need further antihypertensive titration  Hyperlipidemia: Continue 40 mg p.o. daily  Depression: Continue Lexapro 20 mg p.o. daily  GERD: Continue PPI  Dementia --Delirium precautions --Get up during the day --Encourage a familiar face to remain present throughout the day --Keep blinds open and lights on during daylight hours --Minimize the use of opioids/benzodiazepines --Continue Aricept 10 mg p.o. nightly and Namenda 10 mg p.o. twice daily   DVT prophylaxis: Heparin   Code Status: Full Code Family Communication: Updated patients spouse, Todd Cain via telephone this morning  Disposition Plan:  Level of care: Telemetry Status is: Observation  The patient remains OBS appropriate and will d/c before 2 midnights.  Dispo: The patient is from: Home              Anticipated d/c is to: Home  Anticipated d/c date is: 1 day              Patient currently is not medically stable to d/c.   Difficult to place patient No   Consultants:   None  Procedures:    TTE: Pending  Antimicrobials:   None   Subjective: Patient seen and examined bedside, resting comfortably.  In the ED hallway.  No family present.  Pleasantly confused.  Unable to obtain any further ROS from patient due to his underlying dementia.  Appears nontoxic in appearance.  No acute concerns per nursing staff.  Discussed with patient's spouse via telephone this morning, apparently, patient ambulated to the bathroom with her son in which he became dizzy and fell towards the shower but no loss of consciousness, no seizure-like activity.  Patient also apparently does not drink much during the day, and only drinks sweet tea and which likely complicates with underlying dehydration.  Discussed with wife that patient needs to be encouraged and drink more water throughout the day given his underlying dementia complicates his factor.  Objective: Vitals:   12/28/20 1830 12/28/20 1946 12/29/20 0624 12/29/20 0829  BP: (!) 166/101 (!) 165/94 (!) 166/85 (!) 157/96  Pulse: 84 92 85 87  Resp:  20 16 16   Temp: 99.1 F (37.3 C) 99.2 F (37.3 C) 98.2 F (36.8 C) 98.1 F (36.7 C)  TempSrc: Oral Rectal    SpO2: 96% 93% 98% 94%   No intake or output data in the 24 hours ending 12/29/20 1022 There were no vitals filed for this visit.  Examination:  General exam: Appears calm and comfortable, pleasantly confused, nontoxic in appearance Respiratory system: Clear to auscultation. Respiratory effort normal.  Oxygenating well on room air Cardiovascular system: S1 & S2 heard, RRR. No JVD, murmurs, rubs, gallops or clicks. No pedal edema. Gastrointestinal system: Abdomen is nondistended, soft and nontender. No organomegaly or masses felt. Normal bowel sounds heard. Central nervous system: Alert, oriented to time(2022), but not person Mining engineer), not place (in his room), nor situation. No focal neurological deficits. Extremities: Symmetric 5 x 5 power. Skin: No rashes, lesions or  ulcers Psychiatry: Judgement and insight are poor. Mood & affect appropriate.     Data Reviewed: I have personally reviewed following labs and imaging studies  CBC: Recent Labs  Lab 12/28/20 1933 12/29/20 0345  WBC 11.2* 8.1  NEUTROABS 8.0*  --   HGB 16.5 12.0*  HCT 49.9 36.4*  MCV 95.2 96.8  PLT 254 062   Basic Metabolic Panel: Recent Labs  Lab 12/28/20 1933 12/29/20 0345  NA 140 139  K 3.9 3.4*  CL 106 108  CO2 24 24  GLUCOSE 73 66*  BUN 10 12  CREATININE 1.41* 1.26*  CALCIUM 9.1 8.9   GFR: CrCl cannot be calculated (Unknown ideal weight.). Liver Function Tests: Recent Labs  Lab 12/28/20 1933 12/29/20 0345  AST 18 18  20   ALT 16 18  16   ALKPHOS 84 86  84  BILITOT 1.3* 1.5*  1.6*  PROT 6.3* 6.0*  6.2*  ALBUMIN 3.7 3.8  3.5   No results for input(s): LIPASE, AMYLASE in the last 168 hours. No results for input(s): AMMONIA in the last 168 hours. Coagulation Profile: No results for input(s): INR, PROTIME in the last 168 hours. Cardiac Enzymes: No results for input(s): CKTOTAL, CKMB, CKMBINDEX, TROPONINI in the last 168 hours. BNP (last 3 results) No results for input(s): PROBNP in the last 8760 hours. HbA1C: Recent Labs  12/29/20 0345  HGBA1C 5.4   CBG: Recent Labs  Lab 12/29/20 0644  GLUCAP 76   Lipid Profile: No results for input(s): CHOL, HDL, LDLCALC, TRIG, CHOLHDL, LDLDIRECT in the last 72 hours. Thyroid Function Tests: Recent Labs    12/29/20 0345  TSH 1.199   Anemia Panel: No results for input(s): VITAMINB12, FOLATE, FERRITIN, TIBC, IRON, RETICCTPCT in the last 72 hours. Sepsis Labs: No results for input(s): PROCALCITON, LATICACIDVEN in the last 168 hours.  No results found for this or any previous visit (from the past 240 hour(s)).       Radiology Studies: DG Chest 2 View  Result Date: 12/28/2020 CLINICAL DATA:  80 year old male with fall.  Shortness of breath. EXAM: CHEST - 2 VIEW COMPARISON:  None FINDINGS: There  is chronic interstitial coarsening. No focal consolidation, pleural effusion, pneumothorax. Mild eventration of the right hemidiaphragm. There is mild cardiomegaly. Atherosclerotic calcification of the aortic arch. No acute osseous pathology. IMPRESSION: No active cardiopulmonary disease. Electronically Signed   By: Anner Crete M.D.   On: 12/28/2020 22:40   CT Head Wo Contrast  Result Date: 12/28/2020 CLINICAL DATA:  Dizziness fall EXAM: CT HEAD WITHOUT CONTRAST TECHNIQUE: Contiguous axial images were obtained from the base of the skull through the vertex without intravenous contrast. COMPARISON:  MRI 05/03/2017 FINDINGS: Brain: No acute territorial infarction, hemorrhage, or intracranial mass. Advanced hypodensity in the white matter consistent with chronic small vessel ischemic change. Advanced atrophy. Chronic appearing right basal ganglial infarct. Nonenlarged ventricles. Vascular: No hyperdense vessels. Vertebral and carotid vascular calcification Skull: Normal. Negative for fracture or focal lesion. Sinuses/Orbits: Mucosal thickening in the sinuses Other: None IMPRESSION: 1. No CT evidence for acute intracranial abnormality. 2. Atrophy and chronic small vessel ischemic changes of the white matter. Chronic appearing right basal ganglial infarct. Electronically Signed   By: Donavan Foil M.D.   On: 12/28/2020 22:52        Scheduled Meds: . benazepril  10 mg Oral Daily  . buPROPion  300 mg Oral Daily  . carvedilol  12.5 mg Oral BID WC  . donepezil  10 mg Oral QHS  . dorzolamide-timolol  1 drop Both Eyes Daily  . escitalopram  20 mg Oral Daily  . heparin  5,000 Units Subcutaneous Q8H  . latanoprost  1 drop Both Eyes QHS  . memantine  10 mg Oral BID  . pantoprazole  40 mg Oral Daily  . pravastatin  40 mg Oral Daily  . sodium chloride flush  3 mL Intravenous Q12H   Continuous Infusions:   LOS: 0 days    Time spent: 39 minutes spent on chart review, discussion with nursing staff,  consultants, updating family and interview/physical exam; more than 50% of that time was spent in counseling and/or coordination of care.    Liylah Najarro J [place] DO [facility] Available via Epic secure chat 7am-7pm After these hours, please refer to coverage provider listed on [email or web address] 12/29/2020, 10:22 AM

## 2020-12-29 NOTE — ED Notes (Signed)
Pt provided with breakfast tray.

## 2020-12-29 NOTE — ED Notes (Signed)
Pt threw his legs over the railing, attempting to get up stated he needed the pee can. This tech tried to redirect pt to bed in order to grab urinal. Pt refused. Pt then proceeded to scoot to the end of the bed to get up. Pt got both legs stuck in between the bedside railings. Pt not easily redirected. With help from charge and more techs, pt was successfully put back into the bed. Pts feet are raised and head of the bed is dropped down in order to help prevent pt from self harm or fall. Pt assisted to use urinal, pt did not void.

## 2020-12-30 ENCOUNTER — Observation Stay (HOSPITAL_COMMUNITY): Payer: Medicare Other

## 2020-12-30 DIAGNOSIS — E860 Dehydration: Secondary | ICD-10-CM | POA: Diagnosis present

## 2020-12-30 DIAGNOSIS — K219 Gastro-esophageal reflux disease without esophagitis: Secondary | ICD-10-CM | POA: Diagnosis present

## 2020-12-30 DIAGNOSIS — G300 Alzheimer's disease with early onset: Secondary | ICD-10-CM | POA: Diagnosis not present

## 2020-12-30 DIAGNOSIS — H9110 Presbycusis, unspecified ear: Secondary | ICD-10-CM | POA: Diagnosis present

## 2020-12-30 DIAGNOSIS — F0390 Unspecified dementia without behavioral disturbance: Secondary | ICD-10-CM | POA: Diagnosis present

## 2020-12-30 DIAGNOSIS — Z6834 Body mass index (BMI) 34.0-34.9, adult: Secondary | ICD-10-CM | POA: Diagnosis not present

## 2020-12-30 DIAGNOSIS — Z79899 Other long term (current) drug therapy: Secondary | ICD-10-CM | POA: Diagnosis not present

## 2020-12-30 DIAGNOSIS — Z87891 Personal history of nicotine dependence: Secondary | ICD-10-CM | POA: Diagnosis not present

## 2020-12-30 DIAGNOSIS — I10 Essential (primary) hypertension: Secondary | ICD-10-CM | POA: Diagnosis present

## 2020-12-30 DIAGNOSIS — W19XXXA Unspecified fall, initial encounter: Secondary | ICD-10-CM | POA: Diagnosis present

## 2020-12-30 DIAGNOSIS — R55 Syncope and collapse: Secondary | ICD-10-CM

## 2020-12-30 DIAGNOSIS — Z20822 Contact with and (suspected) exposure to covid-19: Secondary | ICD-10-CM | POA: Diagnosis present

## 2020-12-30 DIAGNOSIS — H409 Unspecified glaucoma: Secondary | ICD-10-CM | POA: Diagnosis present

## 2020-12-30 DIAGNOSIS — R17 Unspecified jaundice: Secondary | ICD-10-CM | POA: Diagnosis present

## 2020-12-30 DIAGNOSIS — D649 Anemia, unspecified: Secondary | ICD-10-CM | POA: Diagnosis present

## 2020-12-30 DIAGNOSIS — J449 Chronic obstructive pulmonary disease, unspecified: Secondary | ICD-10-CM | POA: Diagnosis present

## 2020-12-30 DIAGNOSIS — I951 Orthostatic hypotension: Secondary | ICD-10-CM

## 2020-12-30 DIAGNOSIS — I252 Old myocardial infarction: Secondary | ICD-10-CM | POA: Diagnosis not present

## 2020-12-30 DIAGNOSIS — Y92009 Unspecified place in unspecified non-institutional (private) residence as the place of occurrence of the external cause: Secondary | ICD-10-CM | POA: Diagnosis not present

## 2020-12-30 DIAGNOSIS — E876 Hypokalemia: Secondary | ICD-10-CM | POA: Diagnosis not present

## 2020-12-30 DIAGNOSIS — H9190 Unspecified hearing loss, unspecified ear: Secondary | ICD-10-CM | POA: Diagnosis present

## 2020-12-30 DIAGNOSIS — E669 Obesity, unspecified: Secondary | ICD-10-CM | POA: Diagnosis present

## 2020-12-30 DIAGNOSIS — E785 Hyperlipidemia, unspecified: Secondary | ICD-10-CM | POA: Diagnosis present

## 2020-12-30 DIAGNOSIS — I452 Bifascicular block: Secondary | ICD-10-CM | POA: Diagnosis present

## 2020-12-30 DIAGNOSIS — Z8673 Personal history of transient ischemic attack (TIA), and cerebral infarction without residual deficits: Secondary | ICD-10-CM | POA: Diagnosis not present

## 2020-12-30 DIAGNOSIS — F32A Depression, unspecified: Secondary | ICD-10-CM | POA: Diagnosis present

## 2020-12-30 DIAGNOSIS — E162 Hypoglycemia, unspecified: Secondary | ICD-10-CM | POA: Diagnosis present

## 2020-12-30 LAB — PHOSPHORUS: Phosphorus: 3.1 mg/dL (ref 2.5–4.6)

## 2020-12-30 LAB — CBC WITH DIFFERENTIAL/PLATELET
Abs Immature Granulocytes: 0.02 10*3/uL (ref 0.00–0.07)
Basophils Absolute: 0.1 10*3/uL (ref 0.0–0.1)
Basophils Relative: 1 %
Eosinophils Absolute: 0.3 10*3/uL (ref 0.0–0.5)
Eosinophils Relative: 3 %
HCT: 50.9 % (ref 39.0–52.0)
Hemoglobin: 16.9 g/dL (ref 13.0–17.0)
Immature Granulocytes: 0 %
Lymphocytes Relative: 20 %
Lymphs Abs: 2 10*3/uL (ref 0.7–4.0)
MCH: 31.4 pg (ref 26.0–34.0)
MCHC: 33.2 g/dL (ref 30.0–36.0)
MCV: 94.6 fL (ref 80.0–100.0)
Monocytes Absolute: 1.2 10*3/uL — ABNORMAL HIGH (ref 0.1–1.0)
Monocytes Relative: 12 %
Neutro Abs: 6.2 10*3/uL (ref 1.7–7.7)
Neutrophils Relative %: 64 %
Platelets: 210 10*3/uL (ref 150–400)
RBC: 5.38 MIL/uL (ref 4.22–5.81)
RDW: 12.4 % (ref 11.5–15.5)
WBC: 9.7 10*3/uL (ref 4.0–10.5)
nRBC: 0 % (ref 0.0–0.2)

## 2020-12-30 LAB — BASIC METABOLIC PANEL
Anion gap: 10 (ref 5–15)
BUN: 18 mg/dL (ref 8–23)
CO2: 22 mmol/L (ref 22–32)
Calcium: 9.2 mg/dL (ref 8.9–10.3)
Chloride: 105 mmol/L (ref 98–111)
Creatinine, Ser: 1.38 mg/dL — ABNORMAL HIGH (ref 0.61–1.24)
GFR, Estimated: 51 mL/min — ABNORMAL LOW (ref >60)
Glucose, Bld: 65 mg/dL — ABNORMAL LOW (ref 70–99)
Potassium: 4.1 mmol/L (ref 3.5–5.1)
Sodium: 137 mmol/L (ref 135–145)

## 2020-12-30 LAB — GLUCOSE, CAPILLARY
Glucose-Capillary: 62 mg/dL — ABNORMAL LOW (ref 70–99)
Glucose-Capillary: 144 mg/dL — ABNORMAL HIGH (ref 70–99)
Glucose-Capillary: 107 mg/dL — ABNORMAL HIGH (ref 70–99)

## 2020-12-30 LAB — HEPATIC FUNCTION PANEL
ALT: 20 U/L (ref 0–44)
AST: 39 U/L (ref 15–41)
Albumin: 3.9 g/dL (ref 3.5–5.0)
Alkaline Phosphatase: 95 U/L (ref 38–126)
Bilirubin, Direct: 0.3 mg/dL — ABNORMAL HIGH (ref 0.0–0.2)
Indirect Bilirubin: 1.4 mg/dL — ABNORMAL HIGH (ref 0.3–0.9)
Total Bilirubin: 1.7 mg/dL — ABNORMAL HIGH (ref 0.3–1.2)
Total Protein: 6.6 g/dL (ref 6.5–8.1)

## 2020-12-30 LAB — ECHOCARDIOGRAM COMPLETE
Area-P 1/2: 5.02 cm2
Calc EF: 69.8 %
Height: 67 in
S' Lateral: 2.6 cm
Single Plane A2C EF: 66.2 %
Single Plane A4C EF: 71.6 %
Weight: 3520.31 oz

## 2020-12-30 LAB — MAGNESIUM: Magnesium: 2.1 mg/dL (ref 1.7–2.4)

## 2020-12-30 MED ORDER — DEXTROSE 50 % IV SOLN
INTRAVENOUS | Status: AC
  Administered 2020-12-30: 50 mL
  Filled 2020-12-30: qty 50

## 2020-12-30 MED ORDER — QUETIAPINE FUMARATE 25 MG PO TABS
12.5000 mg | ORAL_TABLET | Freq: Every evening | ORAL | Status: DC | PRN
  Filled 2020-12-30: qty 1

## 2020-12-30 MED ORDER — HALOPERIDOL LACTATE 5 MG/ML IJ SOLN
2.0000 mg | Freq: Once | INTRAMUSCULAR | Status: AC
  Administered 2020-12-30: 2 mg via INTRAVENOUS
  Filled 2020-12-30: qty 1

## 2020-12-30 MED ORDER — SODIUM CHLORIDE 0.9 % IV SOLN: INTRAVENOUS | Status: DC

## 2020-12-30 MED ORDER — SODIUM CHLORIDE 0.9 % IV BOLUS
500.0000 mL | Freq: Once | INTRAVENOUS | Status: AC
  Administered 2020-12-30: 500 mL via INTRAVENOUS

## 2020-12-30 NOTE — Progress Notes (Signed)
PT Cancellation Note  Patient Details Name: Todd Cain MRN: [id number] DOB: 07-15-1941   Cancelled Treatment:    Reason Eval/Treat Not Completed: Patient's level of consciousness. Will check back as schedule permits.   Galen Manila 12/30/2020, 10:29 AM

## 2020-12-30 NOTE — Evaluation (Signed)
Physical Therapy Evaluation Patient Details Name: Todd Cain MRN: [id number] DOB: May 02, 1941 Today's Date: 12/30/2020   History of Present Illness  Todd Cain is a 80 year old male with past medical history significant for advanced dementia, essential hypertension, hyperlipidemia, GERD and COPD who is brought to the ED via EMS following fall at home.  Clinical Impression  Pt admitted with above diagnosis.  Pt currently with functional limitations due to the deficits listed below (see PT Problem List). Pt will benefit from skilled PT to increase their independence and safety with mobility to allow discharge to the venue listed below.  Feel pt will do best d/c to his own environment.  Recommend HHPT and 3-1 BSc if he doesn't already have one.     Follow Up Recommendations Home health PT;Supervision/Assistance - 24 hour    Equipment Recommendations  Other (comment) (if he doesn't have a 3-1 BSC that would be helpful.)    Recommendations for Other Services       Precautions / Restrictions Precautions Precautions: Fall      Mobility  Bed Mobility Overal bed mobility: Needs Assistance Bed Mobility: Supine to Sit     Supine to sit: Min assist;+2 for physical assistance     General bed mobility comments: MIN A of 2 primarily due to fatigue from waking up and HOH.    Transfers Overall transfer level: Needs assistance Equipment used: Rolling walker (2 wheeled) Transfers: Sit to/from Stand Sit to Stand: Min assist;+2 safety/equipment         General transfer comment: Placed walker in front of him and he stood up. no c/o dizziness.  Ambulation/Gait Ambulation/Gait assistance: Min guard;+2 safety/equipment Gait Distance (Feet): 30 Feet Assistive device: Rolling walker (2 wheeled) Gait Pattern/deviations: Decreased step length - right;Decreased step length - left     General Gait Details: Pt ambulated in room with RW.  No dizziness reported, but due to [facility],  pt unable to answer the question.  Stairs            Wheelchair Mobility    Modified Rankin (Stroke Patients Only)       Balance Overall balance assessment: History of Falls                                           Pertinent Vitals/Pain      Home Living Family/patient expects to be discharged to:: Private residence Living Arrangements: Spouse/significant other   Type of Home: House                Prior Function           Comments: Pt stated he used a walker sometimes and doesn't "really have any" steps.  Used written communication due to [facility].     Hand Dominance        Extremity/Trunk Assessment   Upper Extremity Assessment Upper Extremity Assessment: Defer to OT evaluation    Lower Extremity Assessment Lower Extremity Assessment: Generalized weakness       Communication   Communication: HOH  Cognition Arousal/Alertness: Awake/alert Behavior During Therapy: WFL for tasks assessed/performed Overall Cognitive Status: History of cognitive impairments - at baseline Area of Impairment: Orientation                 Orientation Level: Disoriented to;Place;Situation  General Comments General comments (skin integrity, edema, etc.): Ambulated around room and left sitting EOB with nursing and sitter drinking some coffee.    Exercises General Exercises - Lower Extremity Long Arc Quad: Strengthening;Both;10 reps;Seated Hip Flexion/Marching: Strengthening;Seated;Both;10 reps   Assessment/Plan    PT Assessment Patient needs continued PT services  PT Problem List Decreased strength;Decreased activity tolerance;Decreased balance;Decreased mobility;Decreased cognition;Decreased knowledge of use of DME       PT Treatment Interventions DME instruction;Gait training;Functional mobility training;Balance training;Therapeutic activities;Therapeutic exercise;Patient/family education    PT Goals (Current  goals can be found in the Care Plan section)  Acute Rehab PT Goals PT Goal Formulation: Patient unable to participate in goal setting Time For Goal Achievement: 01/13/21 Potential to Achieve Goals: Good    Frequency Min 3X/week   Barriers to discharge        Co-evaluation               AM-PAC PT "6 Clicks" Mobility  Outcome Measure Help needed turning from your back to your side while in a flat bed without using bedrails?: A Little Help needed moving from lying on your back to sitting on the side of a flat bed without using bedrails?: A Little Help needed moving to and from a bed to a chair (including a wheelchair)?: A Little Help needed standing up from a chair using your arms (e.g., wheelchair or bedside chair)?: A Little Help needed to walk in hospital room?: A Little Help needed climbing 3-5 steps with a railing? : A Lot 6 Click Score: 17    End of Session Equipment Utilized During Treatment: Gait belt Activity Tolerance: Patient tolerated treatment well Patient left: in bed;with nursing/sitter in room Nurse Communication: Mobility status PT Visit Diagnosis: Unsteadiness on feet (R26.81);History of falling (Z91.81);Muscle weakness (generalized) (M62.81);Difficulty in walking, not elsewhere classified (R26.2)    Time: 1761-6073 PT Time Calculation (min) (ACUTE ONLY): 25 min   Charges:   PT Evaluation $PT Eval Low Complexity: 1 Low PT Treatments $Gait Training: 8-22 mins        Nasiyah Laverdiere L. Tamala Julian, [place] Pager [phone] 12/30/2020   Galen Manila 12/30/2020, 1:27 PM

## 2020-12-30 NOTE — Evaluation (Signed)
Occupational Therapy Evaluation Patient Details Name: Todd Cain MRN: [id number] DOB: 1941-04-11 Today's Date: 12/30/2020    History of Present Illness Todd Cain is a 80 year old male with past medical history significant for advanced dementia, essential hypertension, hyperlipidemia, GERD and COPD who is brought to the ED via EMS following fall at home.   Clinical Impression   Todd Cain is an 80 year old man with a history of advanced dementia and admitted to hospital after fall at home. Patient supine in bed and asleep. Patient very hard of hearing and communication difficulty. Patient awakened with tactile cues and patient transferred to edge of bed with a hand hold to pull up on. Patient demonstrated ability to don sock with increased time and some difficulty. Patient able to ambulate to bathroom, somewhat unsafely and too quickly to transfer onto toilet. Patient pushed Rw away and appears to not use much. Patient demonstrates ability to perform toileting and toilet transfer and returned to sitting at edge of bed with visual cues. Patient provided with min guard during ambulation - slightly unsteady after being woke up and but no overt loss of balance. Patient washed face at edge of bed. Patient returned to supine without assistance. Patient appears to be near his physical baseline - needing mulitmodal cues for safety in hospital environment. Recommend return home to known environment with 24/7 supervision. If patient does not have 24/7 supervision recommend memory care unit or 24/7 nursing care. Will follow acutely in hospital to keep patient active and participating in functional activities to maintain his abilities.    Follow Up Recommendations  No OT follow up;Supervision/Assistance - 24 hour    Equipment Recommendations  None recommended by OT    Recommendations for Other Services       Precautions / Restrictions Precautions Precautions:  Fall Restrictions Weight Bearing Restrictions: No      Mobility Bed Mobility Overal bed mobility: Needs Assistance Bed Mobility: Supine to Sit     Supine to sit: Min assist     General bed mobility comments: min assist to transfer into sitting - hand hold to pull himself up with.    Transfers Overall transfer level: Needs assistance Equipment used: None Transfers: Sit to/from [facility] Sit to Stand: Min guard         General transfer comment: Patient pushed walker out of the way and ambulated to bathroom, performed toilet transfer and returned to bed. min guard for safety and patient slightly unsteady without overt loss of balance. Patient just awoken and somewhat drowsy potentially effecting his ambulation.    Balance Overall balance assessment: Mild deficits observed, not formally tested                                         ADL either performed or assessed with clinical judgement   ADL Overall ADL's : Needs assistance/impaired Eating/Feeding: Independent   Grooming: Set up;Sitting;Wash/dry face   Upper Body Bathing: Set up   Lower Body Bathing: Set up   Upper Body Dressing : Set up   Lower Body Dressing: Set up Lower Body Dressing Details (indicate cue type and reason): increased time and some difficulty but able to don sock. Exhibits physical abilities to don other clothing as well. Toilet Transfer: Nature conservation officer;Ambulation Toilet Transfer Details (indicate cue type and reason): patient ambulated to bathroom and performed toilet transfer too quickly with  min guard. Difficult to give verbal cues to for safety due to [place] and Hygiene: Min guard;Sit to/from stand               Vision   Vision Assessment?: No apparent visual deficits     Perception     Praxis      Pertinent Vitals/Pain Pain Assessment: No/denies pain     Hand Dominance Right   Extremity/Trunk  Assessment Upper Extremity Assessment Upper Extremity Assessment: Overall WFL for tasks assessed   Lower Extremity Assessment Lower Extremity Assessment: Defer to PT evaluation   Cervical / Trunk Assessment Cervical / Trunk Assessment: Normal   Communication Communication Communication: HOH   Cognition Arousal/Alertness: Awake/alert Behavior During Therapy: WFL for tasks assessed/performed Overall Cognitive Status: Difficult to assess Area of Impairment: Orientation                 Orientation Level: Disoriented to;Place;Situation             General Comments: hx of advanced dementia. Very HOH and communication difficult. best to use written communication.   General Comments  Ambulated around room and left sitting EOB with nursing and sitter drinking some coffee.    Exercises General Exercises - Lower Extremity Long Arc Quad: Strengthening;Both;10 reps;Seated Hip Flexion/Marching: Strengthening;Seated;Both;10 reps   Shoulder Instructions      Home Living Family/patient expects to be discharged to:: Private residence Living Arrangements: Spouse/significant other   Type of Home: House                                  Prior Functioning/Environment          Comments: Per PT note used RW sometimes. Per chart patient typically able to do ADLs with prompting.        OT Problem List: Impaired balance (sitting and/or standing);Decreased cognition;Decreased safety awareness;Decreased knowledge of use of DME or AE      OT Treatment/Interventions: Self-care/ADL training;DME and/or AE instruction;Patient/family education;Therapeutic activities;Balance training    OT Goals(Current goals can be found in the care plan section) Acute Rehab OT Goals OT Goal Formulation: Patient unable to participate in goal setting Time For Goal Achievement: 01/13/21 Potential to Achieve Goals: Good  OT Frequency: Min 2X/week   Barriers to D/C:             Co-evaluation              AM-PAC OT "6 Clicks" Daily Activity     Outcome Measure Help from another person eating meals?: None Help from another person taking care of personal grooming?: A Little Help from another person toileting, which includes using toliet, bedpan, or urinal?: A Little Help from another person bathing (including washing, rinsing, drying)?: A Little Help from another person to put on and taking off regular upper body clothing?: A Little Help from another person to put on and taking off regular lower body clothing?: A Little 6 Click Score: 19   End of Session Nurse Communication:  (okay to see per RN)  Activity Tolerance: Patient tolerated treatment well Patient left: in bed;with call bell/phone within reach;with family/visitor present  OT Visit Diagnosis: Unsteadiness on feet (R26.81)                Time: 0109-3235 OT Time Calculation (min): 12 min Charges:  OT General Charges $OT Visit: 1 Visit OT Evaluation $OT Eval Low Complexity: 1 Low  Ayonna Speranza,  OTR/L Acute Care Rehab Services  Office [phone] Pager: [place] 12/30/2020, 3:48 PM

## 2020-12-30 NOTE — Progress Notes (Addendum)
Hypoglycemic Event  Date and time results received: 12/30/20 0628  Critical Value: CBG/Glucose 62  Name of Provider Notified: B Kyere  Orders Received? Or Actions Taken?:  Notified/paged/Made aware/ Dextrose 50% 12.5 g given IV/CBG rechecked after min- CBG 144

## 2020-12-30 NOTE — Progress Notes (Signed)
°  Echocardiogram 2D Echocardiogram has been performed.  Todd Cain 12/30/2020, 9:07 AM

## 2020-12-30 NOTE — Progress Notes (Signed)
PROGRESS NOTE    Todd Cain  PJK:[id number] DOB: 1941/08/29 DOA: 12/28/2020 PCP: Kathyrn Lass, MD   Brief Narrative:  Todd Cain is a 80 year old male with past medical history significant for advanced dementia, essential hypertension, hyperlipidemia, GERD and COPD who is brought to the ED via EMS following fall at home.  Given his underlying dementia and no family present, history obtained from EMS notes and ED report.  Apparently, patient passed out twice while he was standing and was told to come to the ED by his doctor.  Patient lives at home with his wife, apparently he is able to perform most ADLs independently with some prompting.  Wife reports that he often sleeps during the day and currently is on Aricept and Namenda.  In the ED, temperature was 99.1, HR 84, RR 20, BP 166/101, SPO2 96% on room air.  Sodium 140, potassium 3.9, chloride 106, CO2 24, glucose 73, BUN 10, creatinine 1.41, AST 18, ALT 16, total bilirubin 1.3.  Troponin 4>4.  WBC 11.2, hemoglobin 16.5, platelets 254.  Urinalysis with negative leukocytes, negative nitrite.  CT head without contrast with no evidence for acute intracranial abnormality, atrophy with chronic small vessel ischemic changes of the white matter and chronic appearing right basal ganglia infarct.  Chest x-ray with no acute cardiopulmonary disease process.  EKG personally reviewed with normal sinus rhythm, incomplete right bundle branch block, left axis deviation, normal QTC and no concerning ST elevation/depressions or T wave inversions.  Patient referred for admission by EDP for further evaluation and management of syncopal episode in the setting of advanced dementia.  **Interim History Patient remains orthostatic and significantly dropped his BP readings. He as also a little hypoglycemic this AM. Will give a 500 mL Bolus, start IVF Maintenance, and get TED Hose. PT/OT evaluation is still pending but TTE was done   Assessment & Plan:    Active Problems:   Fall at home, initial encounter   GERD (gastroesophageal reflux disease)   COPD with chronic bronchitis ([place])   Dementia without behavioral disturbance ([place])   Presbyacusia   Syncope  Presyncopal syncopal episode with fall at home -Patient presenting to ED following reported fall x2 at home.   -Unclear if loss of consciousness.  Patient is afebrile without leukocytosis, urinalysis unrevealing, CT head without acute findings. Chest x-ray negative.   -TSH within normal limits at 1.199.  Electrolytes within normal limits.   -Troponin negative x2 and was 4.   -Spouse denies loss of consciousness, no seizure-like activity.   -Suspect dehydration versus vasovagal episode versus vertigo in the setting of poor fluid intake outpatient as patient only ingest sweet tea for hydration per wife. -Orthostatic vital signs: POSITVE as BP went from 173/92 lying to 159/91 Sitting and 97/74 Standing  -TTE: done and showed "Left Ventricle: Left ventricular ejection fraction, by estimation, is 65 to 70%.  The left ventricle has normal function. The left ventricle has no regional wall motion abnormalities. The left ventricular internal cavity size was normal in size. There is moderate left ventricular hypertrophy of the basal-septal segment. Left ventricular diastolic parameters are consistent with Grade I diastolic  dysfunction (impaired relaxation). Elevated left ventricular end-diastolic pressure."  -PT/OT evaluation is still pending -Will give 500 mL Bolus, Start NS at 75 mL/hr, and order TED Hose -Continue to monitor on telemetry for arrhythmia -He has been Hypoglycemic but ? From poor po Intake; Blood Sugar ranging from 62-144 -Supportive care  Hypokalemia -Improved. K+ went from 3.4 -> 4.1 -  Mag Level was 2.1 -Continue to Monitor and Trend -Repeat CMP in the AM   Hypoglycemia -Mild in the setting of poor po intake -CBG ranging from 62-144 -HbA1c was 5.4 -Continue to Monitor  and Trend closely   Essential Hypertension -Continue Carvedilol 12.5 mg p.o. twice daily -Continue benazepril 10 mg p.o. daily -Continue monitor BP closely, may need further antihypertensive titration -Continue to Monitor BP per Protocol  -Last BP was 107/64  Hyperlipidemia -Continue Pravastatin 40 mg p.o. daily  Depression -Continue Escitalopram 20 mg p.o. daily and Bupropion 300 mg po Daily  -Also continune Quetiapine 12.5 mg po qHSprn  GERD -Continue PPI with Pantoprazole 40 mg po Daily   Glaucoma -C/w Dorzolamide-Timolol 1 drop both eyes Daily and Lataoprost 1 drop in both eyes qHS  Dementia -C/w Delirium precautions -Get up during the day -Encourage a familiar face to remain present throughout the day -Keep blinds open and lights on during daylight hours -Minimize the use of opioids/benzodiazepines -Continue Aricept 10 mg p.o. nightly and Namenda 10 mg p.o. twice daily -Was somnolent this AM   Normocytic Anemia -Patient's Hgb/Hct on Admission was 12.0/36.4 and is now 16.9/50.9 and ? Accuracy -Check Anemia Panel in the AM -Continue to Monitor for S/Sx of Bleeding; Currently no overt bleeding noted -Repeat CBC in the AM   Hyperbilirubinemia -Unclear Etiology -T Bili on Admission was 1.5 with a Direct of 0.2 and an Indirect of 1.3 -Today T Bili is 1.7 with Direct of 0.3 and Indirect of 1.4 -Continue to Monitor and Trend -Repeat CMP in the AM   Obesity  -Complicates overall prognosis and care -Estimated body mass index is 34.46 kg/m as calculated from the following:   Height as of this encounter: 5\' 7"  (1.702 m).   Weight as of this encounter: 99.8 kg. -Weight Loss and Dietary Counseling given    DVT prophylaxis: Heparin 5,000 units sq q8h Code Status: FULL CODE  Family Communication: No family present at bedside  Disposition Plan: Pending further clinical improvement and when he is not orthostatic   Status is: Observation  The patient will require  care spanning > 2 midnights and should be moved to inpatient because: Unsafe d/c plan, IV treatments appropriate due to intensity of illness or inability to take PO and Inpatient level of care appropriate due to severity of illness  Dispo: The patient is from: Home              Anticipated d/c is to: TBD              Anticipated d/c date is:1-2 days              Patient currently is not medically stable to d/c.   Difficult to place patient No  Consultants:   None   Procedures:  ECHOCARDIOGRAM IMPRESSIONS    1. Left ventricular ejection fraction, by estimation, is 65 to 70%. The  left ventricle has normal function. The left ventricle has no regional  wall motion abnormalities. There is moderate left ventricular hypertrophy  of the basal-septal segment. Left  ventricular diastolic parameters are consistent with Grade I diastolic  dysfunction (impaired relaxation). Elevated left ventricular end-diastolic  pressure.  2. Right ventricular systolic function is normal. The right ventricular  size is normal. Tricuspid regurgitation signal is inadequate for assessing  PA pressure.  3. The mitral valve is normal in structure. No evidence of mitral valve  regurgitation. No evidence of mitral stenosis.  4. The aortic valve is normal in  structure. Aortic valve regurgitation is  not visualized. No aortic stenosis is present.  5. The inferior vena cava is normal in size with greater than 50%  respiratory variability, suggesting right atrial pressure of 3 mmHg.   FINDINGS  Left Ventricle: Left ventricular ejection fraction, by estimation, is 65  to 70%. The left ventricle has normal function. The left ventricle has no  regional wall motion abnormalities. The left ventricular internal cavity  size was normal in size. There is  moderate left ventricular hypertrophy of the basal-septal segment. Left  ventricular diastolic parameters are consistent with Grade I diastolic  dysfunction  (impaired relaxation). Elevated left ventricular end-diastolic  pressure.   Right Ventricle: The right ventricular size is normal. No increase in  right ventricular wall thickness. Right ventricular systolic function is  normal. Tricuspid regurgitation signal is inadequate for assessing PA  pressure.   Left Atrium: Left atrial size was normal in size.   Right Atrium: Right atrial size was normal in size.   Pericardium: There is no evidence of pericardial effusion. Presence of  pericardial fat pad.   Mitral Valve: The mitral valve is normal in structure. Mild mitral annular  calcification. No evidence of mitral valve regurgitation. No evidence of  mitral valve stenosis.   Tricuspid Valve: The tricuspid valve is normal in structure. Tricuspid  valve regurgitation is trivial. No evidence of tricuspid stenosis.   Aortic Valve: The aortic valve is normal in structure. Aortic valve  regurgitation is not visualized. No aortic stenosis is present.   Pulmonic Valve: The pulmonic valve was normal in structure. Pulmonic valve  regurgitation is not visualized. No evidence of pulmonic stenosis.   Aorta: The aortic root is normal in size and structure.   Venous: The inferior vena cava is normal in size with greater than 50%  respiratory variability, suggesting right atrial pressure of 3 mmHg.   IAS/Shunts: No atrial level shunt detected by color flow Doppler.     LEFT VENTRICLE  PLAX 2D  LVIDd:     3.70 cm   Diastology  LVIDs:     2.60 cm   LV e' medial:  4.57 cm/s  LV PW:     1.40 cm   LV E/e' medial: 13.5  LV IVS:    1.40 cm   LV e' lateral:  4.57 cm/s  LVOT diam:   2.00 cm   LV E/e' lateral: 13.5  LV SV:     64  LV SV Index:  31  LVOT Area:   3.14 cm    LV Volumes (MOD)  LV vol d, MOD A2C: 44.1 ml  LV vol d, MOD A4C: 47.6 ml  LV vol s, MOD A2C: 14.9 ml  LV vol s, MOD A4C: 13.5 ml  LV SV MOD A2C:   29.2 ml  LV SV MOD A4C:   47.6  ml  LV SV MOD BP:   33.0 ml   RIGHT VENTRICLE       IVC  RV S prime:   13.50 cm/s IVC diam: 1.40 cm  TAPSE (M-mode): 1.5 cm   LEFT ATRIUM       Index   RIGHT ATRIUM     Index  LA diam:    2.60 cm 1.23 cm/m RA Area:   7.43 cm  LA Vol (A2C):  14.2 ml 6.74 ml/m RA Volume:  13.00 ml 6.17 ml/m  LA Vol (A4C):  12.7 ml 6.03 ml/m  LA Biplane Vol: 13.7 ml 6.51 ml/m  AORTIC VALVE  LVOT Vmax:  112.00 cm/s  LVOT Vmean: 83.900 cm/s  LVOT VTI:  0.205 m    AORTA  Ao Root diam: 3.40 cm  Ao Asc diam: 2.60 cm   MITRAL VALVE  MV Area (PHT): 5.02 cm   SHUNTS  MV Decel Time: 151 msec   Systemic VTI: 0.20 m  MV E velocity: 61.70 cm/s  Systemic Diam: 2.00 cm  MV A velocity: 109.00 cm/s  MV E/A ratio: 0.57   Antimicrobials:  Anti-infectives (From admission, onward)   None        Subjective: Seen and examined at bedside and he was extremely somnolent and very difficult to arouse.  Later on he became more alert and then became extremely hypotensive.  This morning he was unable to provide a subjective history given his somnolence and drowsiness.  Nursing reports no other acute issues noted besides his blood pressure dropping.  Objective: Vitals:   12/29/20 1958 12/29/20 2342 12/30/20 0334 12/30/20 0356  BP: (!) 145/93 136/84  (!) 157/82  Pulse: 79 84  87  Resp: 20 20  20   Temp: (!) 97.5 F (36.4 C) 97.8 F (36.6 C)  98.1 F (36.7 C)  TempSrc: Axillary     SpO2: 97% 94%  95%  Weight:   99.8 kg   Height:   5\' 7"  (1.702 m)     Intake/Output Summary (Last 24 hours) at 12/30/2020 0736 Last data filed at 12/29/2020 2000 Gross per 24 hour  Intake 200 ml  Output --  Net 200 ml   Filed Weights   12/30/20 0334  Weight: 99.8 kg   Examination: Physical Exam:  Constitutional: WN/WD obese elderly Caucasian male currently in NAD and appears calm and comfortable Eyes: Has his eyes closed but lids appear normal. ENMT: External Ears, Nose  appear normal. Grossly normal hearing.   Neck: Appears normal, supple, no cervical masses, normal ROM, no appreciable thyromegaly; no JVD Respiratory: Diminished to auscultation bilaterally, no wheezing, rales, rhonchi or crackles. Normal respiratory effort and patient is not tachypenic. No accessory muscle use.  Unlabored breathing Cardiovascular: RRR, no murmurs / rubs / gallops. S1 and S2 auscultated.  No appreciable extremity edema Abdomen: Soft, non-tender, distended secondary body habitus. Bowel sounds positive.  GU: Deferred. Musculoskeletal: No clubbing / cyanosis of digits/nails. No joint deformity upper and lower extremities noted Skin: No rashes, lesions, ulcers on to skin evaluation. No induration; Warm and dry.  Neurologic: Does not follow commands given his somnolence and drowsiness.  Briefly awakens and goes back to sleep Psychiatric: Impaired judgment and insight.  He is not alert and oriented x 3 as he is extremely somnolent and drowsy. Normal mood and appropriate affect.   Data Reviewed: I have personally reviewed following labs and imaging studies  CBC: Recent Labs  Lab 12/28/20 1933 12/29/20 0345  WBC 11.2* 8.1  NEUTROABS 8.0*  --   HGB 16.5 12.0*  HCT 49.9 36.4*  MCV 95.2 96.8  PLT 254 254   Basic Metabolic Panel: Recent Labs  Lab 12/28/20 1933 12/29/20 0345 12/30/20 0526  NA 140 139 137  K 3.9 3.4* 4.1  CL 106 108 105  CO2 24 24 22   GLUCOSE 73 66* 65*  BUN 10 12 18   CREATININE 1.41* 1.26* 1.38*  CALCIUM 9.1 8.9 9.2  MG  --   --  2.1   GFR: Estimated Creatinine Clearance: 47.3 mL/min (A) (by C-G formula based on SCr of 1.38 mg/dL (H)). Liver Function Tests: Recent Labs  Lab 12/28/20  1933 12/29/20 0345  AST 18 18  20   ALT 16 18  16   ALKPHOS 84 86  84  BILITOT 1.3* 1.5*  1.6*  PROT 6.3* 6.0*  6.2*  ALBUMIN 3.7 3.8  3.5   No results for input(s): LIPASE, AMYLASE in the last 168 hours. No results for input(s): AMMONIA in the last 168  hours. Coagulation Profile: No results for input(s): INR, PROTIME in the last 168 hours. Cardiac Enzymes: No results for input(s): CKTOTAL, CKMB, CKMBINDEX, TROPONINI in the last 168 hours. BNP (last 3 results) No results for input(s): PROBNP in the last 8760 hours. HbA1C: Recent Labs    12/29/20 0345  HGBA1C 5.4   CBG: Recent Labs  Lab 12/29/20 0644 12/30/20 0627 12/30/20 0644  GLUCAP 76 62* 144*   Lipid Profile: No results for input(s): CHOL, HDL, LDLCALC, TRIG, CHOLHDL, LDLDIRECT in the last 72 hours. Thyroid Function Tests: Recent Labs    12/29/20 0345  TSH 1.199   Anemia Panel: No results for input(s): VITAMINB12, FOLATE, FERRITIN, TIBC, IRON, RETICCTPCT in the last 72 hours. Sepsis Labs: No results for input(s): PROCALCITON, LATICACIDVEN in the last 168 hours.  Recent Results (from the past 240 hour(s))  SARS CORONAVIRUS 2 (TAT 6-24 HRS) Nasopharyngeal Nasopharyngeal Swab     Status: None   Collection Time: 12/29/20 12:28 AM   Specimen: Nasopharyngeal Swab  Result Value Ref Range Status   SARS Coronavirus 2 NEGATIVE NEGATIVE Final    Comment: (NOTE) SARS-CoV-2 target nucleic acids are NOT DETECTED.  The SARS-CoV-2 RNA is generally detectable in upper and lower respiratory specimens during the acute phase of infection. Negative results do not preclude SARS-CoV-2 infection, do not rule out co-infections with other pathogens, and should not be used as the sole basis for treatment or other patient management decisions. Negative results must be combined with clinical observations, patient history, and epidemiological information. The expected result is Negative.  Fact Sheet for Patients: SugarRoll.be  Fact Sheet for Healthcare Providers: https://www.woods-mathews.com/  This test is not yet approved or cleared by the [place] FDA and  has been authorized for detection and/or diagnosis of SARS-CoV-2 by FDA under  an Emergency Use Authorization (EUA). This EUA will remain  in effect (meaning this test can be used) for the duration of the COVID-19 declaration under Se ction 564(b)(1) of the Act, 21 U.S.C. section 360bbb-3(b)(1), unless the authorization is terminated or revoked sooner.  Performed at [facility], [place] [address] [place], [place] [address]      RN Pressure Injury Documentation:     Estimated body mass index is 34.46 kg/m as calculated from the following:   Height as of this encounter: 5\' 7"  (1.702 m).   Weight as of this encounter: 99.8 kg.  Malnutrition Type:   Malnutrition Characteristics:   Nutrition Interventions:     Radiology Studies: DG Chest 2 View  Result Date: 12/28/2020 CLINICAL DATA:  80 year old male with fall.  Shortness of breath. EXAM: CHEST - 2 VIEW COMPARISON:  None FINDINGS: There is chronic interstitial coarsening. No focal consolidation, pleural effusion, pneumothorax. Mild eventration of the right hemidiaphragm. There is mild cardiomegaly. Atherosclerotic calcification of the aortic arch. No acute osseous pathology. IMPRESSION: No active cardiopulmonary disease. Electronically Signed   By: Anner Crete M.D.   On: 12/28/2020 22:40   CT Head Wo Contrast  Result Date: 12/28/2020 CLINICAL DATA:  Dizziness fall EXAM: CT HEAD WITHOUT CONTRAST TECHNIQUE: Contiguous axial images were obtained from the base of the skull  through the vertex without intravenous contrast. COMPARISON:  MRI 05/03/2017 FINDINGS: Brain: No acute territorial infarction, hemorrhage, or intracranial mass. Advanced hypodensity in the white matter consistent with chronic small vessel ischemic change. Advanced atrophy. Chronic appearing right basal ganglial infarct. Nonenlarged ventricles. Vascular: No hyperdense vessels. Vertebral and carotid vascular calcification Skull: Normal. Negative for fracture or focal lesion. Sinuses/Orbits: Mucosal thickening in the sinuses Other: None  IMPRESSION: 1. No CT evidence for acute intracranial abnormality. 2. Atrophy and chronic small vessel ischemic changes of the white matter. Chronic appearing right basal ganglial infarct. Electronically Signed   By: Donavan Foil M.D.   On: 12/28/2020 22:52   Scheduled Meds: . benazepril  10 mg Oral Daily  . buPROPion  300 mg Oral Daily  . carvedilol  12.5 mg Oral BID WC  . donepezil  10 mg Oral QHS  . dorzolamide-timolol  1 drop Both Eyes Daily  . escitalopram  20 mg Oral Daily  . heparin  5,000 Units Subcutaneous Q8H  . latanoprost  1 drop Both Eyes QHS  . memantine  10 mg Oral BID  . pantoprazole  40 mg Oral Daily  . pravastatin  40 mg Oral Daily  . sodium chloride flush  3 mL Intravenous Q12H   Continuous Infusions:   LOS: 0 days   Kerney Elbe, DO [facility] PAGER is on AMION  If 7PM-7AM, please contact night-coverage [email or web address]

## 2020-12-31 ENCOUNTER — Other Ambulatory Visit: Payer: Self-pay | Admitting: Adult Health

## 2020-12-31 LAB — CBC WITH DIFFERENTIAL/PLATELET
Abs Immature Granulocytes: 0.09 10*3/uL — ABNORMAL HIGH (ref 0.00–0.07)
Basophils Absolute: 0 10*3/uL (ref 0.0–0.1)
Basophils Relative: 0 %
Eosinophils Absolute: 0.3 10*3/uL (ref 0.0–0.5)
Eosinophils Relative: 3 %
HCT: 47.1 % (ref 39.0–52.0)
Hemoglobin: 15.6 g/dL (ref 13.0–17.0)
Immature Granulocytes: 1 %
Lymphocytes Relative: 23 %
Lymphs Abs: 2.1 10*3/uL (ref 0.7–4.0)
MCH: 31.5 pg (ref 26.0–34.0)
MCHC: 33.1 g/dL (ref 30.0–36.0)
MCV: 95.2 fL (ref 80.0–100.0)
Monocytes Absolute: 1.1 10*3/uL — ABNORMAL HIGH (ref 0.1–1.0)
Monocytes Relative: 12 %
Neutro Abs: 5.5 10*3/uL (ref 1.7–7.7)
Neutrophils Relative %: 61 %
Platelets: 190 10*3/uL (ref 150–400)
RBC: 4.95 MIL/uL (ref 4.22–5.81)
RDW: 12.5 % (ref 11.5–15.5)
WBC: 9 10*3/uL (ref 4.0–10.5)
nRBC: 0 % (ref 0.0–0.2)

## 2020-12-31 LAB — COMPREHENSIVE METABOLIC PANEL
ALT: 24 U/L (ref 0–44)
AST: 30 U/L (ref 15–41)
Albumin: 3.4 g/dL — ABNORMAL LOW (ref 3.5–5.0)
Alkaline Phosphatase: 78 U/L (ref 38–126)
Anion gap: 9 (ref 5–15)
BUN: 19 mg/dL (ref 8–23)
CO2: 22 mmol/L (ref 22–32)
Calcium: 8.9 mg/dL (ref 8.9–10.3)
Chloride: 107 mmol/L (ref 98–111)
Creatinine, Ser: 1.31 mg/dL — ABNORMAL HIGH (ref 0.61–1.24)
GFR, Estimated: 55 mL/min — ABNORMAL LOW (ref >60)
Glucose, Bld: 59 mg/dL — ABNORMAL LOW (ref 70–99)
Potassium: 3.8 mmol/L (ref 3.5–5.1)
Sodium: 138 mmol/L (ref 135–145)
Total Bilirubin: 1.4 mg/dL — ABNORMAL HIGH (ref 0.3–1.2)
Total Protein: 5.9 g/dL — ABNORMAL LOW (ref 6.5–8.1)

## 2020-12-31 LAB — GLUCOSE, CAPILLARY
Glucose-Capillary: 67 mg/dL — ABNORMAL LOW (ref 70–99)
Glucose-Capillary: 103 mg/dL — ABNORMAL HIGH (ref 70–99)

## 2020-12-31 LAB — PHOSPHORUS: Phosphorus: 3 mg/dL (ref 2.5–4.6)

## 2020-12-31 LAB — MAGNESIUM: Magnesium: 2.1 mg/dL (ref 1.7–2.4)

## 2020-12-31 MED ORDER — QUETIAPINE FUMARATE 25 MG PO TABS: 12.5000 mg | ORAL_TABLET | Freq: Every evening | ORAL | 0 refills | Status: AC | PRN

## 2020-12-31 NOTE — Progress Notes (Signed)
Assumed care of this patient at 1530.  Wife called to notify of [facility]'s arrival and transport to the house. Paperwork sent with PT[facility].

## 2020-12-31 NOTE — Progress Notes (Incomplete)
PROGRESS NOTE    Todd Cain  KGU:[id number] DOB: 1941/10/08 DOA: 12/28/2020 PCP: Kathyrn Lass, MD   Brief Narrative:  Todd Cain is a 80 year old male with past medical history significant for advanced dementia, essential hypertension, hyperlipidemia, GERD and COPD who is brought to the ED via EMS following fall at home.  Given his underlying dementia and no family present, history obtained from EMS notes and ED report.  Apparently, patient passed out twice while he was standing and was told to come to the ED by his doctor.  Patient lives at home with his wife, apparently he is able to perform most ADLs independently with some prompting.  Wife reports that he often sleeps during the day and currently is on Aricept and Namenda.  In the ED, temperature was 99.1, HR 84, RR 20, BP 166/101, SPO2 96% on room air.  Sodium 140, potassium 3.9, chloride 106, CO2 24, glucose 73, BUN 10, creatinine 1.41, AST 18, ALT 16, total bilirubin 1.3.  Troponin 4>4.  WBC 11.2, hemoglobin 16.5, platelets 254.  Urinalysis with negative leukocytes, negative nitrite.  CT head without contrast with no evidence for acute intracranial abnormality, atrophy with chronic small vessel ischemic changes of the white matter and chronic appearing right basal ganglia infarct.  Chest x-ray with no acute cardiopulmonary disease process.  EKG personally reviewed with normal sinus rhythm, incomplete right bundle branch block, left axis deviation, normal QTC and no concerning ST elevation/depressions or T wave inversions.  Patient referred for admission by EDP for further evaluation and management of syncopal episode in the setting of advanced dementia.  **Interim History Patient remained orthostatic and significantly dropped his BP readings yesterday. He was also a little hypoglycemic yesterday AM and this AM. Will give a 500 mL Bolus, start IVF Maintenance, and get TED Hose. PT/OT evaluation is still pending but TTE was done    Assessment & Plan:   Active Problems:   Fall at home, initial encounter   GERD (gastroesophageal reflux disease)   COPD with chronic bronchitis ([place])   Dementia without behavioral disturbance ([place])   Presbyacusia   Syncope  Presyncopal syncopal episode with fall at home -Patient presenting to ED following reported fall x2 at home.   -Unclear if loss of consciousness.  Patient is afebrile without leukocytosis, urinalysis unrevealing, CT head without acute findings. Chest x-ray negative.   -TSH within normal limits at 1.199.  Electrolytes within normal limits.   -Troponin negative x2 and was 4.   -Spouse denies loss of consciousness, no seizure-like activity.   -Suspect dehydration versus vasovagal episode versus vertigo in the setting of poor fluid intake outpatient as patient only ingest sweet tea for hydration per wife. -Orthostatic vital signs: POSITVE as BP went from 173/92 lying to 159/91 Sitting and 97/74 Standing  -TTE: done and showed "Left Ventricle: Left ventricular ejection fraction, by estimation, is 65 to 70%.  The left ventricle has normal function. The left ventricle has no regional wall motion abnormalities. The left ventricular internal cavity size was normal in size. There is moderate left ventricular hypertrophy of the basal-septal segment. Left ventricular diastolic parameters are consistent with Grade I diastolic  dysfunction (impaired relaxation). Elevated left ventricular end-diastolic pressure."  -PT/OT evaluation is still pending -Will give 500 mL Bolus, Start NS at 75 mL/hr, and order TED Hose -Continue to monitor on telemetry for arrhythmia -He has been Hypoglycemic but ? From poor po Intake; Blood Sugar ranging from 67-144 -Supportive care  Hypokalemia -Improved. K+ went  from 3.4 -> 4.1 -> 3.8 -Mag Level was 2.1 -Continue to Monitor and Trend -Repeat CMP in the AM   Hypoglycemia -Mild in the setting of poor po intake -CBG ranging from 67-144; Blood  Sugar on CMP this AM was 59 -HbA1c was 5.4 -Continue to Monitor and Trend closely   Essential Hypertension -Continue Carvedilol 12.5 mg p.o. twice daily -Continue Benazepril 10 mg p.o. daily -Continue monitor BP closely, may need further antihypertensive titration -Continue to Monitor BP per Protocol  -Last BP was 145/78 this AM   Hyperlipidemia -Continue Pravastatin 40 mg p.o. daily  Depression -Continue Escitalopram 20 mg p.o. daily and Bupropion 300 mg po Daily  -Also continune Quetiapine 12.5 mg po qHSprn  GERD -Continue PPI with Pantoprazole 40 mg po Daily   Glaucoma -C/w Dorzolamide-Timolol 1 drop both eyes Daily and Lataoprost 1 drop in both eyes qHS  Dementia -C/w Delirium precautions -Get up during the day -Encourage a familiar face to remain present throughout the day -Keep blinds open and lights on during daylight hours -Minimize the use of opioids/benzodiazepines -Continue Aricept 10 mg p.o. nightly and Namenda 10 mg p.o. twice daily -Was somnolent yesterday AM   Normocytic Anemia -Patient's Hgb/Hct on Admission was 12.0/36.4 and is now 16.9/50.9 and ? Accuracy; Repeat this AM is 15.6/47.1 -Check Anemia Panel in the AM -Continue to Monitor for S/Sx of Bleeding; Currently no overt bleeding noted -Repeat CBC in the AM   Hyperbilirubinemia -Unclear Etiology -T Bili on Admission was 1.5 with a Direct of 0.2 and an Indirect of 1.3 -Yesterday T Bili is 1.7 with Direct of 0.3 and Indirect of 1.4 and now T Bili is trending down to 1.4 -Continue to Monitor and Trend -Repeat CMP in the AM   Obesity  -Complicates overall prognosis and care -Estimated body mass index is 32.7 kg/m as calculated from the following:   Height as of this encounter: 5\' 7"  (1.702 m).   Weight as of this encounter: 94.7 kg. -Weight Loss and Dietary Counseling given    DVT prophylaxis: Heparin 5,000 units sq q8h Code Status: FULL CODE  Family Communication: No family present at  bedside  Disposition Plan: Pending further clinical improvement and when he is not orthostatic   Status is: Observation  The patient will require care spanning > 2 midnights and should be moved to inpatient because: Unsafe d/c plan, IV treatments appropriate due to intensity of illness or inability to take PO and Inpatient level of care appropriate due to severity of illness  Dispo: The patient is from: Home              Anticipated d/c is to: TBD              Anticipated d/c date is:1-2 days              Patient currently is not medically stable to d/c.   Difficult to place patient No  Consultants:   None   Procedures:  ECHOCARDIOGRAM IMPRESSIONS    1. Left ventricular ejection fraction, by estimation, is 65 to 70%. The  left ventricle has normal function. The left ventricle has no regional  wall motion abnormalities. There is moderate left ventricular hypertrophy  of the basal-septal segment. Left  ventricular diastolic parameters are consistent with Grade I diastolic  dysfunction (impaired relaxation). Elevated left ventricular end-diastolic  pressure.  2. Right ventricular systolic function is normal. The right ventricular  size is normal. Tricuspid regurgitation signal is inadequate for assessing  PA pressure.  3. The mitral valve is normal in structure. No evidence of mitral valve  regurgitation. No evidence of mitral stenosis.  4. The aortic valve is normal in structure. Aortic valve regurgitation is  not visualized. No aortic stenosis is present.  5. The inferior vena cava is normal in size with greater than 50%  respiratory variability, suggesting right atrial pressure of 3 mmHg.   FINDINGS  Left Ventricle: Left ventricular ejection fraction, by estimation, is 65  to 70%. The left ventricle has normal function. The left ventricle has no  regional wall motion abnormalities. The left ventricular internal cavity  size was normal in size. There is  moderate  left ventricular hypertrophy of the basal-septal segment. Left  ventricular diastolic parameters are consistent with Grade I diastolic  dysfunction (impaired relaxation). Elevated left ventricular end-diastolic  pressure.   Right Ventricle: The right ventricular size is normal. No increase in  right ventricular wall thickness. Right ventricular systolic function is  normal. Tricuspid regurgitation signal is inadequate for assessing PA  pressure.   Left Atrium: Left atrial size was normal in size.   Right Atrium: Right atrial size was normal in size.   Pericardium: There is no evidence of pericardial effusion. Presence of  pericardial fat pad.   Mitral Valve: The mitral valve is normal in structure. Mild mitral annular  calcification. No evidence of mitral valve regurgitation. No evidence of  mitral valve stenosis.   Tricuspid Valve: The tricuspid valve is normal in structure. Tricuspid  valve regurgitation is trivial. No evidence of tricuspid stenosis.   Aortic Valve: The aortic valve is normal in structure. Aortic valve  regurgitation is not visualized. No aortic stenosis is present.   Pulmonic Valve: The pulmonic valve was normal in structure. Pulmonic valve  regurgitation is not visualized. No evidence of pulmonic stenosis.   Aorta: The aortic root is normal in size and structure.   Venous: The inferior vena cava is normal in size with greater than 50%  respiratory variability, suggesting right atrial pressure of 3 mmHg.   IAS/Shunts: No atrial level shunt detected by color flow Doppler.     LEFT VENTRICLE  PLAX 2D  LVIDd:     3.70 cm   Diastology  LVIDs:     2.60 cm   LV e' medial:  4.57 cm/s  LV PW:     1.40 cm   LV E/e' medial: 13.5  LV IVS:    1.40 cm   LV e' lateral:  4.57 cm/s  LVOT diam:   2.00 cm   LV E/e' lateral: 13.5  LV SV:     64  LV SV Index:  31  LVOT Area:   3.14 cm    LV Volumes (MOD)  LV vol d, MOD A2C:  44.1 ml  LV vol d, MOD A4C: 47.6 ml  LV vol s, MOD A2C: 14.9 ml  LV vol s, MOD A4C: 13.5 ml  LV SV MOD A2C:   29.2 ml  LV SV MOD A4C:   47.6 ml  LV SV MOD BP:   33.0 ml   RIGHT VENTRICLE       IVC  RV S prime:   13.50 cm/s IVC diam: 1.40 cm  TAPSE (M-mode): 1.5 cm   LEFT ATRIUM       Index   RIGHT ATRIUM     Index  LA diam:    2.60 cm 1.23 cm/m RA Area:   7.43 cm  LA Vol (A2C):  14.2 ml 6.74 ml/m RA Volume:  13.00 ml 6.17 ml/m  LA Vol (A4C):  12.7 ml 6.03 ml/m  LA Biplane Vol: 13.7 ml 6.51 ml/m  AORTIC VALVE  LVOT Vmax:  112.00 cm/s  LVOT Vmean: 83.900 cm/s  LVOT VTI:  0.205 m    AORTA  Ao Root diam: 3.40 cm  Ao Asc diam: 2.60 cm   MITRAL VALVE  MV Area (PHT): 5.02 cm   SHUNTS  MV Decel Time: 151 msec   Systemic VTI: 0.20 m  MV E velocity: 61.70 cm/s  Systemic Diam: 2.00 cm  MV A velocity: 109.00 cm/s  MV E/A ratio: 0.57   Antimicrobials:  Anti-infectives (From admission, onward)   None        Subjective: Seen and examined at bedside ***  Objective: Vitals:   12/30/20 1538 12/30/20 2124 12/31/20 0525 12/31/20 0555  BP: 135/81 (!) 146/71 (!) 145/78   Pulse: 77 81 83   Resp: 20 18 18    Temp: 98.5 F (36.9 C) (!) 97.5 F (36.4 C) 98.2 F (36.8 C)   TempSrc: Oral Oral Oral   SpO2: 97% 97% 97%   Weight:    94.7 kg  Height:        Intake/Output Summary (Last 24 hours) at 12/31/2020 0738 Last data filed at 12/31/2020 0500 Gross per 24 hour  Intake 1551.85 ml  Output 850 ml  Net 701.85 ml   Filed Weights   12/30/20 0334 12/31/20 0555  Weight: 99.8 kg 94.7 kg   Examination: Physical Exam:  Constitutional: WN/WD, NAD and appears calm and comfortable Eyes: PERRL, lids and conjunctivae normal, sclerae anicteric  ENMT: External Ears, Nose appear normal. Grossly normal hearing. Mucous membranes are moist. Posterior pharynx clear of any exudate or lesions. Normal dentition.  Neck: Appears normal,  supple, no cervical masses, normal ROM, no appreciable thyromegaly Respiratory: Clear to auscultation bilaterally, no wheezing, rales, rhonchi or crackles. Normal respiratory effort and patient is not tachypenic. No accessory muscle use.  Cardiovascular: RRR, no murmurs / rubs / gallops. S1 and S2 auscultated. No extremity edema. 2+ pedal pulses. No carotid bruits.  Abdomen: Soft, non-tender, non-distended. No masses palpated. No appreciable hepatosplenomegaly. Bowel sounds positive.  GU: Deferred. Musculoskeletal: No clubbing / cyanosis of digits/nails. No joint deformity upper and lower extremities. Good ROM, no contractures. Normal strength and muscle tone.  Skin: No rashes, lesions, ulcers. No induration; Warm and dry.  Neurologic: CN 2-12 grossly intact with no focal deficits. Sensation intact in all 4 Extremities, DTR normal. Strength 5/5 in all 4. Romberg sign cerebellar reflexes not assessed.  Psychiatric: Normal judgment and insight. Alert and oriented x 3. Normal mood and appropriate affect.   Data Reviewed: I have personally reviewed following labs and imaging studies  CBC: Recent Labs  Lab 12/28/20 1933 12/29/20 0345 12/30/20 0801 12/31/20 0445  WBC 11.2* 8.1 9.7 9.0  NEUTROABS 8.0*  --  6.2 5.5  HGB 16.5 12.0* 16.9 15.6  HCT 49.9 36.4* 50.9 47.1  MCV 95.2 96.8 94.6 95.2  PLT 254 164 210 903   Basic Metabolic Panel: Recent Labs  Lab 12/28/20 1933 12/29/20 0345 12/30/20 0526 12/30/20 0801 12/31/20 0445  NA 140 139 137  --  138  K 3.9 3.4* 4.1  --  3.8  CL 106 108 105  --  107  CO2 24 24 22   --  22  GLUCOSE 73 66* 65*  --  59*  BUN 10 12 18   --  19  CREATININE  1.41* 1.26* 1.38*  --  1.31*  CALCIUM 9.1 8.9 9.2  --  8.9  MG  --   --  2.1  --  2.1  PHOS  --   --   --  3.1 3.0   GFR: Estimated Creatinine Clearance: 48.5 mL/min (A) (by C-G formula based on SCr of 1.31 mg/dL (H)). Liver Function Tests: Recent Labs  Lab 12/28/20 1933 12/29/20 0345 12/30/20 0801  12/31/20 0445  AST 18 18  20  39 30  ALT 16 18  16 20 24   ALKPHOS 84 86  84 95 78  BILITOT 1.3* 1.5*  1.6* 1.7* 1.4*  PROT 6.3* 6.0*  6.2* 6.6 5.9*  ALBUMIN 3.7 3.8  3.5 3.9 3.4*   No results for input(s): LIPASE, AMYLASE in the last 168 hours. No results for input(s): AMMONIA in the last 168 hours. Coagulation Profile: No results for input(s): INR, PROTIME in the last 168 hours. Cardiac Enzymes: No results for input(s): CKTOTAL, CKMB, CKMBINDEX, TROPONINI in the last 168 hours. BNP (last 3 results) No results for input(s): PROBNP in the last 8760 hours. HbA1C: Recent Labs    12/29/20 0345  HGBA1C 5.4   CBG: Recent Labs  Lab 12/30/20 0627 12/30/20 0644 12/30/20 1255 12/31/20 0519 12/31/20 0603  GLUCAP 62* 144* 107* 67* 103*   Lipid Profile: No results for input(s): CHOL, HDL, LDLCALC, TRIG, CHOLHDL, LDLDIRECT in the last 72 hours. Thyroid Function Tests: Recent Labs    12/29/20 0345  TSH 1.199   Anemia Panel: No results for input(s): VITAMINB12, FOLATE, FERRITIN, TIBC, IRON, RETICCTPCT in the last 72 hours. Sepsis Labs: No results for input(s): PROCALCITON, LATICACIDVEN in the last 168 hours.  Recent Results (from the past 240 hour(s))  SARS CORONAVIRUS 2 (TAT 6-24 HRS) Nasopharyngeal Nasopharyngeal Swab     Status: None   Collection Time: 12/29/20 12:28 AM   Specimen: Nasopharyngeal Swab  Result Value Ref Range Status   SARS Coronavirus 2 NEGATIVE NEGATIVE Final    Comment: (NOTE) SARS-CoV-2 target nucleic acids are NOT DETECTED.  The SARS-CoV-2 RNA is generally detectable in upper and lower respiratory specimens during the acute phase of infection. Negative results do not preclude SARS-CoV-2 infection, do not rule out co-infections with other pathogens, and should not be used as the sole basis for treatment or other patient management decisions. Negative results must be combined with clinical observations, patient history, and epidemiological  information. The expected result is Negative.  Fact Sheet for Patients: SugarRoll.be  Fact Sheet for Healthcare Providers: https://www.woods-mathews.com/  This test is not yet approved or cleared by the [place] FDA and  has been authorized for detection and/or diagnosis of SARS-CoV-2 by FDA under an Emergency Use Authorization (EUA). This EUA will remain  in effect (meaning this test can be used) for the duration of the COVID-19 declaration under Se ction 564(b)(1) of the Act, 21 U.S.C. section 360bbb-3(b)(1), unless the authorization is terminated or revoked sooner.  Performed at [facility], [place] [address], [place], [place] [address]      RN Pressure Injury Documentation:     Estimated body mass index is 32.7 kg/m as calculated from the following:   Height as of this encounter: 5\' 7"  (1.702 m).   Weight as of this encounter: 94.7 kg.  Malnutrition Type:   Malnutrition Characteristics:   Nutrition Interventions:     Radiology Studies: ECHOCARDIOGRAM COMPLETE  Result Date: 12/30/2020    ECHOCARDIOGRAM REPORT   Patient Name:   [facility]  Arenz Date of Exam: 12/30/2020 Medical Rec #:  [id number]           Height:       67.0 in Accession #:    [id number]          Weight:       220.0 lb Date of Birth:  [birth year]           BSA:          2.106 m Patient Age:    43 years            BP:           157/82 mmHg Patient Gender: M                   HR:           82 bpm. Exam Location:  Inpatient Procedure: 2D Echo, Cardiac Doppler and Color Doppler Indications:    R55 Syncope  History:        Patient has no prior history of Echocardiogram examinations.                 COPD, Signs/Symptoms:Altered Mental Status, Alzheimer's and                 Syncope; Risk Factors:Hypertension and Dyslipidemia.  Sonographer:    Roseanna Rainbow RDCS Referring Phys: [id number] [facility] MUJTABA  Sonographer Comments: Technically difficult study due to poor  echo windows. Image acquisition challenging due to respiratory motion. Patient supine, could not follow directions. IMPRESSIONS  1. Left ventricular ejection fraction, by estimation, is 65 to 70%. The left ventricle has normal function. The left ventricle has no regional wall motion abnormalities. There is moderate left ventricular hypertrophy of the basal-septal segment. Left ventricular diastolic parameters are consistent with Grade I diastolic dysfunction (impaired relaxation). Elevated left ventricular end-diastolic pressure.  2. Right ventricular systolic function is normal. The right ventricular size is normal. Tricuspid regurgitation signal is inadequate for assessing PA pressure.  3. The mitral valve is normal in structure. No evidence of mitral valve regurgitation. No evidence of mitral stenosis.  4. The aortic valve is normal in structure. Aortic valve regurgitation is not visualized. No aortic stenosis is present.  5. The inferior vena cava is normal in size with greater than 50% respiratory variability, suggesting right atrial pressure of 3 mmHg. FINDINGS  Left Ventricle: Left ventricular ejection fraction, by estimation, is 65 to 70%. The left ventricle has normal function. The left ventricle has no regional wall motion abnormalities. The left ventricular internal cavity size was normal in size. There is  moderate left ventricular hypertrophy of the basal-septal segment. Left ventricular diastolic parameters are consistent with Grade I diastolic dysfunction (impaired relaxation). Elevated left ventricular end-diastolic pressure. Right Ventricle: The right ventricular size is normal. No increase in right ventricular wall thickness. Right ventricular systolic function is normal. Tricuspid regurgitation signal is inadequate for assessing PA pressure. Left Atrium: Left atrial size was normal in size. Right Atrium: Right atrial size was normal in size. Pericardium: There is no evidence of pericardial  effusion. Presence of pericardial fat pad. Mitral Valve: The mitral valve is normal in structure. Mild mitral annular calcification. No evidence of mitral valve regurgitation. No evidence of mitral valve stenosis. Tricuspid Valve: The tricuspid valve is normal in structure. Tricuspid valve regurgitation is trivial. No evidence of tricuspid stenosis. Aortic Valve: The aortic valve is normal in structure. Aortic valve regurgitation is not visualized. No aortic stenosis is present. Pulmonic Valve: The  pulmonic valve was normal in structure. Pulmonic valve regurgitation is not visualized. No evidence of pulmonic stenosis. Aorta: The aortic root is normal in size and structure. Venous: The inferior vena cava is normal in size with greater than 50% respiratory variability, suggesting right atrial pressure of 3 mmHg. IAS/Shunts: No atrial level shunt detected by color flow Doppler.  LEFT VENTRICLE PLAX 2D LVIDd:         3.70 cm     Diastology LVIDs:         2.60 cm     LV e' medial:    4.57 cm/s LV PW:         1.40 cm     LV E/e' medial:  13.5 LV IVS:        1.40 cm     LV e' lateral:   4.57 cm/s LVOT diam:     2.00 cm     LV E/e' lateral: 13.5 LV SV:         64 LV SV Index:   31 LVOT Area:     3.14 cm  LV Volumes (MOD) LV vol d, MOD A2C: 44.1 ml LV vol d, MOD A4C: 47.6 ml LV vol s, MOD A2C: 14.9 ml LV vol s, MOD A4C: 13.5 ml LV SV MOD A2C:     29.2 ml LV SV MOD A4C:     47.6 ml LV SV MOD BP:      33.0 ml RIGHT VENTRICLE             IVC RV S prime:     13.50 cm/s  IVC diam: 1.40 cm TAPSE (M-mode): 1.5 cm LEFT ATRIUM             Index      RIGHT ATRIUM          Index LA diam:        2.60 cm 1.23 cm/m RA Area:     7.43 cm LA Vol (A2C):   14.2 ml 6.74 ml/m RA Volume:   13.00 ml 6.17 ml/m LA Vol (A4C):   12.7 ml 6.03 ml/m LA Biplane Vol: 13.7 ml 6.51 ml/m  AORTIC VALVE LVOT Vmax:   112.00 cm/s LVOT Vmean:  83.900 cm/s LVOT VTI:    0.205 m  AORTA Ao Root diam: 3.40 cm Ao Asc diam:  2.60 cm MITRAL VALVE MV Area (PHT):  5.02 cm     SHUNTS MV Decel Time: 151 msec     Systemic VTI:  0.20 m MV E velocity: 61.70 cm/s   Systemic Diam: 2.00 cm MV A velocity: 109.00 cm/s MV E/A ratio:  0.57 Fransico Him MD Electronically signed by Fransico Him MD Signature Date/Time: 12/30/2020/11:59:30 AM    Final    Scheduled Meds: . benazepril  10 mg Oral Daily  . buPROPion  300 mg Oral Daily  . carvedilol  12.5 mg Oral BID WC  . donepezil  10 mg Oral QHS  . dorzolamide-timolol  1 drop Both Eyes Daily  . escitalopram  20 mg Oral Daily  . heparin  5,000 Units Subcutaneous Q8H  . latanoprost  1 drop Both Eyes QHS  . memantine  10 mg Oral BID  . pantoprazole  40 mg Oral Daily  . pravastatin  40 mg Oral Daily  . sodium chloride flush  3 mL Intravenous Q12H   Continuous Infusions: . sodium chloride 75 mL/hr at 12/30/20 1919     LOS: 1 day   Kerney Elbe, DO [facility]  PAGER is on AMION  If 7PM-7AM, please contact night-coverage [email or web address]   

## 2020-12-31 NOTE — TOC Transition Note (Signed)
Transition of Care [facility]) - CM/SW Discharge Note   Patient Details  Name: Todd Cain MRN: [id number] Date of Birth: Oct 17, 1941  [facility] CM/SW Contact:  Dessa Phi, RN Phone Number: 12/31/2020, 1:13 PM   Clinical Narrative:  [place] accepted by [facility] rep [place]-HHPT/OT/aide. [facility] for transport-confirmed address.Forms in printer for nsg to put I packet.No further CM needs.     Final next level of care: [place] Barriers to Discharge: No Barriers Identified   Patient Goals and CMS Choice Patient states their goals for this hospitalization and ongoing recovery are:: go home CMS Medicare.gov Compare Post Acute Care list provided to:: Patient Choice offered to / list presented to : Spouse  Discharge Placement                       Discharge Plan and Services   Discharge Planning Services: CM Consult Post Acute Care Choice: Home Health,Durable Medical Equipment          DME Arranged: 3-N-1 DME Agency:  (spouse will check on what they already have, & get it from [place] or [facility])       [place] Arranged: PT,OT,Nurse's Aide [place]: [place] Date [place]: 12/31/20 Time [place]: [place] Representative spoke with at [place]: [place] ([place]) Interventions     Readmission Risk Interventions No flowsheet data found.

## 2020-12-31 NOTE — TOC Initial Note (Signed)
Transition of Care [facility]) - Initial/Assessment Note    Patient Details  Name: Todd Cain MRN: [id number] Date of Birth: 05-14-1941  Transition of Care [facility]) CM/SW Contact:    Dessa Phi, RN Phone Number: 12/31/2020, 12:34 PM  Clinical Narrative: Awaiting for [facility] agency to [place] checking-HHPT/OT/aide. Spoke to spouse Todd Cain about d/c plans-d/c pan home w/HHC(if I can get an agency)  Todd Cain has a BSC she will check if can use it or she will get it from [place] or [facility]. [facility] for transport-confirmed address.Will call [facility] once agency is set up or offer otpt PT(spouse Todd Cain in agreement if unable to get a [place] agency)               Expected Discharge Plan: [place] Barriers to Discharge: No Barriers Identified   Patient Goals and CMS Choice Patient states their goals for this hospitalization and ongoing recovery are:: go home CMS Medicare.gov Compare Post Acute Care list provided to:: Patient Choice offered to / list presented to : Spouse  Expected Discharge Plan and Services Expected Discharge Plan: [place]   Discharge Planning Services: CM Consult Post Acute Care Choice: Home Health,Durable Medical Equipment Living arrangements for the past 2 months: Single Family Home Expected Discharge Date: 12/31/20               DME Arranged: 3-N-1 DME Agency:  (spouse will check on what they already have, & get it from [place] or [facility])                  Prior Living Arrangements/Services Living arrangements for the past 2 months: Single Family Home Lives with:: Spouse Patient language and need for interpreter reviewed:: Yes Do you feel safe going back to the place where you live?: Yes      Need for Family Participation in Patient Care: No (Comment) Care giver support system in place?: Yes (comment) Current home services: DME (BSC) Criminal Activity/Legal Involvement Pertinent to Current  Situation/Hospitalization: No - Comment as needed  Activities of Daily Living Home Assistive Devices/Equipment: Eyeglasses,Dentures (specify type),Hearing aid (right hearing aid, upper/lower dentures) ADL Screening (condition at time of admission) Patient's cognitive ability adequate to safely complete daily activities?: No (patient has dementia) Is the patient deaf or have difficulty hearing?: Yes (HOH-wears right hearing aid) Does the patient have difficulty seeing, even when wearing glasses/contacts?: Yes Does the patient have difficulty concentrating, remembering, or making decisions?: Yes Patient able to express need for assistance with ADLs?: Yes Does the patient have difficulty dressing or bathing?: No Independently performs ADLs?: No Communication: Independent Dressing (OT): Independent Grooming: Independent Feeding: Independent Bathing: Independent Toileting: Needs assistance Is this a change from baseline?: Change from baseline, expected to last >3days In/Out Bed: Needs assistance Is this a change from baseline?: Change from baseline, expected to last >3 days Walks in Home: Needs assistance Is this a change from baseline?: Change from baseline, expected to last >3 days Does the patient have difficulty walking or climbing stairs?: Yes (secondary to weakness) Weakness of Legs: Both Weakness of Arms/Hands: None  Permission Sought/Granted Permission sought to share information with : Case Manager Permission granted to share information with : Yes, Verbal Permission Granted  Share Information with NAME: Case Manager     Permission granted to share info w Relationship: Todd Cain spouse [phone]     Emotional Assessment Appearance:: Appears stated age Attitude/Demeanor/Rapport: Gracious Affect (typically observed): Accepting Orientation: :  Oriented to Self Alcohol / Substance Use: Not Applicable Psych Involvement: No (comment)  Admission diagnosis:  Syncope [R55] Syncope,  unspecified syncope type [R55] Patient Active Problem List   Diagnosis Date Noted  . 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 Dementia without behavioral disturbance ([place]) 12/29/2020  . 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 Syncope 12/29/2020  . Fall at home, initial encounter 12/28/2020  . GERD (gastroesophageal reflux disease) 12/28/2020  . Memory disorder 04/20/2017  . HOH (hard of hearing) 04/20/2017   PCP:  Kathyrn Lass, MD Pharmacy:   [id number] - [place], [place] - [address]  [place] [address] Phone: [phone] Fax: [phone]     Social Determinants of Health (SDOH) Interventions    Readmission Risk Interventions No flowsheet data found.

## 2020-12-31 NOTE — Discharge Summary (Signed)
Physician Discharge Summary  Todd Cain HDQ:[id number] DOB: November 11, 1940 DOA: 12/28/2020  PCP: Kathyrn Lass, MD  Admit date: 12/28/2020 Discharge date: 12/31/2020  Admitted From: Home Disposition: Home with [facility] PT/OT with 24/hour Supervision/Assistance   Recommendations for Outpatient Follow-up:  1. Follow up with PCP in 1-2 weeks 2. Follow up with Cardiology within 1-2 weeks for Holter Monitor  3. Follow up with Dr. Margette Fast of Neurology within 1-2 weeks  4. Please obtain CMP/CBC, Mag, Phos in one week 5. Please follow up on the following pending results:  Home Health: YES  Equipment/Devices: 3in1 Bedside Commode  Discharge Condition: Stable CODE STATUS: FULL CODE Diet recommendation:   Brief/Interim Summary: Todd Dubray Sheddis a 80 year old male with past medical history significant for advanced dementia, essential hypertension, hyperlipidemia, GERD and COPD who is brought to the ED via EMS following fall at home. Given his underlying dementia and no family present, history obtained from EMS notes and ED report. Apparently, patient passed out twice while he was standing and was told to come to the ED by his doctor. Patient lives at home with his wife, apparently he is able to perform most ADLs independently with some prompting. Wife reports that he often sleeps during the day and currently is on Aricept and Namenda.  In the ED, temperature was 99.1, HR 84, RR 20, BP 166/101, SPO2 96% on room air. Sodium 140, potassium 3.9, chloride 106, CO2 24, glucose 73, BUN 10, creatinine 1.41, AST 18, ALT 16, total bilirubin 1.3. Troponin 4>4.WBC 11.2, hemoglobin 16.5, platelets 254. Urinalysis with negative leukocytes, negative nitrite. CT head without contrast with no evidence for acute intracranial abnormality, atrophy with chronic small vessel ischemic changes of the white matter and chronic appearing right basal ganglia infarct. Chest x-ray with no acute  cardiopulmonary disease process. EKG personally reviewed with normal sinus rhythm, incomplete right bundle branch block, left axis deviation, normal QTC and no concerning ST elevation/depressions or T wave inversions. Patient referred for admission by EDP for further evaluation and management of syncopal episode in the setting of advanced dementia.  **Interim History Patient remained orthostatic and significantly dropped his BP readings yesterday. He was also a little hypoglycemic yesterday AM and this AM. Will give a 500 mL Bolus, start IVF Maintenance, and get TED Hose. PT/OT evaluation recommending Home Health with 24/hr Supervision; TTE was done and as below. His orthostatic Vital Signs improved significantly and he is deemed stable for D/C Home now with PT/OT to follow at D/C.   Discharge Diagnoses:  Active Problems:   Fall at home, initial encounter   GERD (gastroesophageal reflux disease)   COPD with chronic bronchitis ([place])   Dementia without behavioral disturbance ([place])   Presbyacusia   Syncope   Presyncopalsyncopal episode with fall at home -Patient presenting to ED following reported fall x2 at home.  -Unclear if loss of consciousness but likely not. Patient is afebrile without leukocytosis, urinalysis unrevealing, CT head without acute findings. Chest x-ray negative.  -TSH within normal limits at 1.199. Electrolytes within normal limits.  -Troponin negative x2 and was 4.  -Spouse denies loss of consciousness, no seizure-like activity.  -Suspect dehydration versus vasovagal episode versus vertigo in the setting of poor fluid intake outpatient as patient only ingest sweet tea for hydration per wife. Likley from Dehydration as he is improved  -Orthostatic vital signs: POSITVE yesterday But NEGATIVE today as BP went from lying BP 123/56 HR 70, sitting 129/77 83, standing 113/71 71, standing for 3 mins 119/72  89 -TTE: done and showed "Left Ventricle: Left ventricular ejection  fraction, by estimation, is 65 to 70%.  The left ventricle has normal function. The left ventricle has no regional wall motion abnormalities. The left ventricular internal cavity size was normal in size. There is moderate left ventricular hypertrophy of the basal-septal segment. Left ventricular diastolic parameters are consistent with Grade I diastolic  dysfunction (impaired relaxation). Elevated left ventricular end-diastolic pressure."  -PT/OT evaluation is still pending -He was given a 500 mL Bolus and Started on NS at 75 mL/hr, and order TED Hose -Continue to monitor on telemetry for arrhythmia; consider outpatient Holter Monitor  -He has been Hypoglycemic but ? From poor po Intake; Blood Sugar ranging from 67-144 -C/w Supportive care and follow up with PCP as an outpatient   Hypokalemia -Improved. K+ went from 3.4 -> 4.1 -> 3.8 -Mag Level was 2.1 -Continue to Monitor and Trend -Repeat CMP in the AM   Hypoglycemia -Mild in the setting of poor po intake -CBG ranging from 67-144; Blood Sugar on CMP this AM was 59 -HbA1c was 5.4 -Continue to Monitor and Trend closely and likely from not eating   Essential Hypertension -Continue Carvedilol 12.5 mg p.o. twice daily -Continue Benazepril 10 mg p.o. daily -Continue monitor BP closely, may need further antihypertensive titration -Continue to Monitor BP per Protocol  -Last BP was 145/78 this AM and repeat was 109/64  Hyperlipidemia -Continue Pravastatin 40 mg p.o. daily  Depression -Continue Escitalopram 20 mg p.o. daily and Bupropion 300 mg po Daily  -Also continune Quetiapine 12.5 mg po qHSprn at discharge for Agitation if necessary   GERD -Continue PPI with Pantoprazole 40 mg po Daily   Glaucoma -C/w Dorzolamide-Timolol 1 drop both eyes Daily and Lataoprost 1 drop in both eyes qHS  Dementia -C/w Delirium precautions -Get up during the day -Encourage a familiar face to remain present throughout the day -Keep blinds  open and lights on during daylight hours -Minimize the use of opioids/benzodiazepines -Continue Aricept 10 mg p.o. nightly and Namenda 10 mg p.o. twice daily -Was somnolent yesterday AM and this AM but easily arousable; He is hard of Hearing  -Follow up with Neurology as an outpatient within 1-2 weeks   Normocytic Anemia -Patient's Hgb/Hct on Admission was 12.0/36.4 and is now 16.9/50.9 and ? Accuracy; Repeat this AM is 15.6/47.1 -Check Anemia Panel as an outpatient  -Continue to Monitor for S/Sx of Bleeding; Currently no overt bleeding noted -Repeat CBC in the AM   Hyperbilirubinemia -Unclear Etiology -T Bili on Admission was 1.5 with a Direct of 0.2 and an Indirect of 1.3 -Yesterday T Bili is 1.7 with Direct of 0.3 and Indirect of 1.4 and now T Bili is trending down to 1.4 -Continue to Monitor and Trend -Repeat CMP in the AM   Obesity  -Complicates overall prognosis and care -Estimated body mass index is 32.7 kg/m as calculated from the following:   Height as of this encounter: 5\' 7"  (1.702 m).   Weight as of this encounter: 94.7 kg. -Weight Loss and Dietary Counseling given   Discharge Instructions Discharge Instructions    Call MD for:  difficulty breathing, headache or visual disturbances   Complete by: As directed    Call MD for:  extreme fatigue   Complete by: As directed    Call MD for:  hives   Complete by: As directed    Call MD for:  persistant dizziness or light-headedness   Complete by: As directed  Call MD for:  persistant nausea and vomiting   Complete by: As directed    Call MD for:  redness, tenderness, or signs of infection (pain, swelling, redness, odor or green/yellow discharge around incision site)   Complete by: As directed    Call MD for:  severe uncontrolled pain   Complete by: As directed    Call MD for:  temperature >100.4   Complete by: As directed    Diet - low sodium heart healthy   Complete by: As directed    Discharge instructions    Complete by: As directed    You were cared for by a hospitalist during your hospital stay. If you have any questions about your discharge medications or the care you received while you were in the hospital after you are discharged, you can call the unit and ask to speak with the hospitalist on call if the hospitalist that took care of you is not available. Once you are discharged, your primary care physician will handle any further medical issues. Please note that NO REFILLS for any discharge medications will be authorized once you are discharged, as it is imperative that you return to your primary care physician (or establish a relationship with a primary care physician if you do not have one) for your aftercare needs so that they can reassess your need for medications and monitor your lab values.  Follow up with PCP and Cardiology as an outpatient. Take all medications as prescribed. If symptoms change or worsen please return to the ED for evaluation   Increase activity slowly   Complete by: As directed      Allergies as of 12/31/2020   No Known Allergies     Medication List    TAKE these medications   benazepril 10 MG tablet Commonly known as: LOTENSIN Take 10 mg by mouth daily.   buPROPion 300 MG 24 hr tablet Commonly known as: WELLBUTRIN XL Take 300 mg by mouth daily.   carvedilol 12.5 MG tablet Commonly known as: COREG Take 12.5 mg by mouth 2 (two) times daily with a meal.   donepezil 10 MG tablet Commonly known as: ARICEPT Take 1 tablet (10 mg total) by mouth at bedtime.   dorzolamide-timolol 22.3-6.8 MG/ML ophthalmic solution Commonly known as: COSOPT Place 1 drop into both eyes daily.   escitalopram 20 MG tablet Commonly known as: LEXAPRO Take 20 mg by mouth daily.   latanoprost 0.005 % ophthalmic solution Commonly known as: XALATAN Place 1 drop into both eyes at bedtime.   levocetirizine 5 MG tablet Commonly known as: XYZAL Take 5 mg by mouth daily as needed for  allergies.   memantine 10 MG tablet Commonly known as: NAMENDA TAKE 1 TABLET BY MOUTH TWICE A DAY   multivitamin with minerals Tabs tablet Take 1 tablet by mouth daily.   omeprazole 20 MG capsule Commonly known as: PRILOSEC Take 20 mg by mouth daily.   pravastatin 40 MG tablet Commonly known as: PRAVACHOL Take 40 mg by mouth daily.   QUEtiapine 25 MG tablet Commonly known as: SEROQUEL Take 0.5 tablets (12.5 mg total) by mouth at bedtime as needed (agitation).   VITAMIN D PO Take 1 capsule by mouth daily.            Durable Medical Equipment  (From admission, onward)         Start     Ordered   12/31/20 1211  DME 3-in-1  Once        12/31/20  1211          No Known Allergies  Consultations:  None  Procedures/Studies: DG Chest 2 View  Result Date: 12/28/2020 CLINICAL DATA:  80 year old male with fall.  Shortness of breath. EXAM: CHEST - 2 VIEW COMPARISON:  None FINDINGS: There is chronic interstitial coarsening. No focal consolidation, pleural effusion, pneumothorax. Mild eventration of the right hemidiaphragm. There is mild cardiomegaly. Atherosclerotic calcification of the aortic arch. No acute osseous pathology. IMPRESSION: No active cardiopulmonary disease. Electronically Signed   By: Anner Crete M.D.   On: 12/28/2020 22:40   CT Head Wo Contrast  Result Date: 12/28/2020 CLINICAL DATA:  Dizziness fall EXAM: CT HEAD WITHOUT CONTRAST TECHNIQUE: Contiguous axial images were obtained from the base of the skull through the vertex without intravenous contrast. COMPARISON:  MRI 05/03/2017 FINDINGS: Brain: No acute territorial infarction, hemorrhage, or intracranial mass. Advanced hypodensity in the white matter consistent with chronic small vessel ischemic change. Advanced atrophy. Chronic appearing right basal ganglial infarct. Nonenlarged ventricles. Vascular: No hyperdense vessels. Vertebral and carotid vascular calcification Skull: Normal. Negative for  fracture or focal lesion. Sinuses/Orbits: Mucosal thickening in the sinuses Other: None IMPRESSION: 1. No CT evidence for acute intracranial abnormality. 2. Atrophy and chronic small vessel ischemic changes of the white matter. Chronic appearing right basal ganglial infarct. Electronically Signed   By: Donavan Foil M.D.   On: 12/28/2020 22:52   ECHOCARDIOGRAM COMPLETE  Result Date: 12/30/2020    ECHOCARDIOGRAM REPORT   Patient Name:   Todd Cain [facility] Date of Exam: 12/30/2020 Medical Rec #:  [id number]           Height:       67.0 in Accession #:    [id number]          Weight:       220.0 lb Date of Birth:  1941/05/27           BSA:          2.106 m Patient Age:    31 years            BP:           157/82 mmHg Patient Gender: M                   HR:           82 bpm. Exam Location:  Inpatient Procedure: 2D Echo, Cardiac Doppler and Color Doppler Indications:    R55 Syncope  History:        Patient has no prior history of Echocardiogram examinations.                 COPD, Signs/Symptoms:Altered Mental Status, Alzheimer's and                 Syncope; Risk Factors:Hypertension and Dyslipidemia.  Sonographer:    Roseanna Rainbow RDCS Referring Phys: [id number] [facility] MUJTABA  Sonographer Comments: Technically difficult study due to poor echo windows. Image acquisition challenging due to respiratory motion. Patient supine, could not follow directions. IMPRESSIONS  1. Left ventricular ejection fraction, by estimation, is 65 to 70%. The left ventricle has normal function. The left ventricle has no regional wall motion abnormalities. There is moderate left ventricular hypertrophy of the basal-septal segment. Left ventricular diastolic parameters are consistent with Grade I diastolic dysfunction (impaired relaxation). Elevated left ventricular end-diastolic pressure.  2. Right ventricular systolic function is normal. The right ventricular size is normal. Tricuspid regurgitation signal is inadequate for assessing PA  pressure.  3. The mitral valve is normal in structure. No evidence of mitral valve regurgitation. No evidence of mitral stenosis.  4. The aortic valve is normal in structure. Aortic valve regurgitation is not visualized. No aortic stenosis is present.  5. The inferior vena cava is normal in size with greater than 50% respiratory variability, suggesting right atrial pressure of 3 mmHg. FINDINGS  Left Ventricle: Left ventricular ejection fraction, by estimation, is 65 to 70%. The left ventricle has normal function. The left ventricle has no regional wall motion abnormalities. The left ventricular internal cavity size was normal in size. There is  moderate left ventricular hypertrophy of the basal-septal segment. Left ventricular diastolic parameters are consistent with Grade I diastolic dysfunction (impaired relaxation). Elevated left ventricular end-diastolic pressure. Right Ventricle: The right ventricular size is normal. No increase in right ventricular wall thickness. Right ventricular systolic function is normal. Tricuspid regurgitation signal is inadequate for assessing PA pressure. Left Atrium: Left atrial size was normal in size. Right Atrium: Right atrial size was normal in size. Pericardium: There is no evidence of pericardial effusion. Presence of pericardial fat pad. Mitral Valve: The mitral valve is normal in structure. Mild mitral annular calcification. No evidence of mitral valve regurgitation. No evidence of mitral valve stenosis. Tricuspid Valve: The tricuspid valve is normal in structure. Tricuspid valve regurgitation is trivial. No evidence of tricuspid stenosis. Aortic Valve: The aortic valve is normal in structure. Aortic valve regurgitation is not visualized. No aortic stenosis is present. Pulmonic Valve: The pulmonic valve was normal in structure. Pulmonic valve regurgitation is not visualized. No evidence of pulmonic stenosis. Aorta: The aortic root is normal in size and structure. Venous: The  inferior vena cava is normal in size with greater than 50% respiratory variability, suggesting right atrial pressure of 3 mmHg. IAS/Shunts: No atrial level shunt detected by color flow Doppler.  LEFT VENTRICLE PLAX 2D LVIDd:         3.70 cm     Diastology LVIDs:         2.60 cm     LV e' medial:    4.57 cm/s LV PW:         1.40 cm     LV E/e' medial:  13.5 LV IVS:        1.40 cm     LV e' lateral:   4.57 cm/s LVOT diam:     2.00 cm     LV E/e' lateral: 13.5 LV SV:         64 LV SV Index:   31 LVOT Area:     3.14 cm  LV Volumes (MOD) LV vol d, MOD A2C: 44.1 ml LV vol d, MOD A4C: 47.6 ml LV vol s, MOD A2C: 14.9 ml LV vol s, MOD A4C: 13.5 ml LV SV MOD A2C:     29.2 ml LV SV MOD A4C:     47.6 ml LV SV MOD BP:      33.0 ml RIGHT VENTRICLE             IVC RV S prime:     13.50 cm/s  IVC diam: 1.40 cm TAPSE (M-mode): 1.5 cm LEFT ATRIUM             Index      RIGHT ATRIUM          Index LA diam:        2.60 cm 1.23 cm/m RA Area:     7.43 cm LA Vol (A2C):  14.2 ml 6.74 ml/m RA Volume:   13.00 ml 6.17 ml/m LA Vol (A4C):   12.7 ml 6.03 ml/m LA Biplane Vol: 13.7 ml 6.51 ml/m  AORTIC VALVE LVOT Vmax:   112.00 cm/s LVOT Vmean:  83.900 cm/s LVOT VTI:    0.205 m  AORTA Ao Root diam: 3.40 cm Ao Asc diam:  2.60 cm MITRAL VALVE MV Area (PHT): 5.02 cm     SHUNTS MV Decel Time: 151 msec     Systemic VTI:  0.20 m MV E velocity: 61.70 cm/s   Systemic Diam: 2.00 cm MV A velocity: 109.00 cm/s MV E/A ratio:  0.57 Fransico Him MD Electronically signed by Fransico Him MD Signature Date/Time: 12/30/2020/11:59:30 AM    Final     Subjective: Seen and examined at bedside and he is extremely hard of hearing and was sleeping when I walked in I woke him from sleep.  Denies any complaints at this time.  His blood pressure did not drop on orthostatics and he has been deemed medically stable so he will be discharged home with close outpatient follow-up.  He will need to see his neurologist within 1 to 2 weeks as well as his PCP.  Recommending  Holter monitor at discharge but this can be arranged by PCP or cardiology office as an outpatient.  Discharge Exam: Vitals:   12/31/20 0525 12/31/20 1100  BP: (!) 145/78 109/64  Pulse: 83 71  Resp: 18 18  Temp: 98.2 F (36.8 C) 98.6 F (37 C)  SpO2: 97% 95%   Vitals:   12/30/20 2124 12/31/20 0525 12/31/20 0555 12/31/20 1100  BP: (!) 146/71 (!) 145/78  109/64  Pulse: 81 83  71  Resp: 18 18  18   Temp: (!) 97.5 F (36.4 C) 98.2 F (36.8 C)  98.6 F (37 C)  TempSrc: Oral Oral  Axillary  SpO2: 97% 97%  95%  Weight:   94.7 kg   Height:       General: Pt is somnolent and a little drowsy but easily awoken, not in acute distress Cardiovascular: RRR, S1/S2 +, no rubs, no gallops Respiratory: Diminished bilaterally, no wheezing, no rhonchi Abdominal: Soft, NT, Distended 2/2 to body habitus, bowel sounds + Extremities: Trace edema, no cyanosis  The results of significant diagnostics from this hospitalization (including imaging, microbiology, ancillary and laboratory) are listed below for reference.    Microbiology: Recent Results (from the past 240 hour(s))  SARS CORONAVIRUS 2 (TAT 6-24 HRS) Nasopharyngeal Nasopharyngeal Swab     Status: None   Collection Time: 12/29/20 12:28 AM   Specimen: Nasopharyngeal Swab  Result Value Ref Range Status   SARS Coronavirus 2 NEGATIVE NEGATIVE Final    Comment: (NOTE) SARS-CoV-2 target nucleic acids are NOT DETECTED.  The SARS-CoV-2 RNA is generally detectable in upper and lower respiratory specimens during the acute phase of infection. Negative results do not preclude SARS-CoV-2 infection, do not rule out co-infections with other pathogens, and should not be used as the sole basis for treatment or other patient management decisions. Negative results must be combined with clinical observations, patient history, and epidemiological information. The expected result is Negative.  Fact Sheet for  Patients: SugarRoll.be  Fact Sheet for Healthcare Providers: https://www.woods-mathews.com/  This test is not yet approved or cleared by the [place] FDA and  has been authorized for detection and/or diagnosis of SARS-CoV-2 by FDA under an Emergency Use Authorization (EUA). This EUA will remain  in effect (meaning this test can be used) for the  duration of the COVID-19 declaration under Se ction 564(b)(1) of the Act, 21 U.S.C. section 360bbb-3(b)(1), unless the authorization is terminated or revoked sooner.  Performed at [facility], [place] [address] [place], [place] [address]     Labs: BNP (last 3 results) Recent Labs    12/29/20 0345  BNP 50.3   Basic Metabolic Panel: Recent Labs  Lab 12/28/20 1933 12/29/20 0345 12/30/20 0526 12/30/20 0801 12/31/20 0445  NA 140 139 137  --  138  K 3.9 3.4* 4.1  --  3.8  CL 106 108 105  --  107  CO2 24 24 22   --  22  GLUCOSE 73 66* 65*  --  59*  BUN 10 12 18   --  19  CREATININE 1.41* 1.26* 1.38*  --  1.31*  CALCIUM 9.1 8.9 9.2  --  8.9  MG  --   --  2.1  --  2.1  PHOS  --   --   --  3.1 3.0   Liver Function Tests: Recent Labs  Lab 12/28/20 1933 12/29/20 0345 12/30/20 0801 12/31/20 0445  AST 18 18  20  39 30  ALT 16 18  16 20 24   ALKPHOS 84 86  84 95 78  BILITOT 1.3* 1.5*  1.6* 1.7* 1.4*  PROT 6.3* 6.0*  6.2* 6.6 5.9*  ALBUMIN 3.7 3.8  3.5 3.9 3.4*   No results for input(s): LIPASE, AMYLASE in the last 168 hours. No results for input(s): AMMONIA in the last 168 hours. CBC: Recent Labs  Lab 12/28/20 1933 12/29/20 0345 12/30/20 0801 12/31/20 0445  WBC 11.2* 8.1 9.7 9.0  NEUTROABS 8.0*  --  6.2 5.5  HGB 16.5 12.0* 16.9 15.6  HCT 49.9 36.4* 50.9 47.1  MCV 95.2 96.8 94.6 95.2  PLT 254 164 210 190   Cardiac Enzymes: No results for input(s): CKTOTAL, CKMB, CKMBINDEX, TROPONINI in the last 168 hours. BNP: Invalid input(s): POCBNP CBG: Recent Labs  Lab  12/30/20 0627 12/30/20 0644 12/30/20 1255 12/31/20 0519 12/31/20 0603  GLUCAP 62* 144* 107* 67* 103*   D-Dimer No results for input(s): DDIMER in the last 72 hours. Hgb A1c Recent Labs    12/29/20 0345  HGBA1C 5.4   Lipid Profile No results for input(s): CHOL, HDL, LDLCALC, TRIG, CHOLHDL, LDLDIRECT in the last 72 hours. Thyroid function studies Recent Labs    12/29/20 0345  TSH 1.199   Anemia work up No results for input(s): VITAMINB12, FOLATE, FERRITIN, TIBC, IRON, RETICCTPCT in the last 72 hours. Urinalysis    Component Value Date/Time   COLORURINE YELLOW 12/28/2020 1848   APPEARANCEUR CLEAR 12/28/2020 1848   LABSPEC 1.011 12/28/2020 1848   PHURINE 7.0 12/28/2020 1848   GLUCOSEU NEGATIVE 12/28/2020 1848   HGBUR NEGATIVE 12/28/2020 1848   BILIRUBINUR NEGATIVE 12/28/2020 1848   KETONESUR NEGATIVE 12/28/2020 1848   PROTEINUR NEGATIVE 12/28/2020 1848   UROBILINOGEN 0.2 01/28/2013 1615   NITRITE NEGATIVE 12/28/2020 1848   LEUKOCYTESUR NEGATIVE 12/28/2020 1848   Sepsis Labs Invalid input(s): PROCALCITONIN,  WBC,  LACTICIDVEN Microbiology Recent Results (from the past 240 hour(s))  SARS CORONAVIRUS 2 (TAT 6-24 HRS) Nasopharyngeal Nasopharyngeal Swab     Status: None   Collection Time: 12/29/20 12:28 AM   Specimen: Nasopharyngeal Swab  Result Value Ref Range Status   SARS Coronavirus 2 NEGATIVE NEGATIVE Final    Comment: (NOTE) SARS-CoV-2 target nucleic acids are NOT DETECTED.  The SARS-CoV-2 RNA is generally detectable in upper and lower respiratory specimens during  the acute phase of infection. Negative results do not preclude SARS-CoV-2 infection, do not rule out co-infections with other pathogens, and should not be used as the sole basis for treatment or other patient management decisions. Negative results must be combined with clinical observations, patient history, and epidemiological information. The expected result is Negative.  Fact Sheet for  Patients: SugarRoll.be  Fact Sheet for Healthcare Providers: https://www.woods-mathews.com/  This test is not yet approved or cleared by the [place] FDA and  has been authorized for detection and/or diagnosis of SARS-CoV-2 by FDA under an Emergency Use Authorization (EUA). This EUA will remain  in effect (meaning this test can be used) for the duration of the COVID-19 declaration under Se ction 564(b)(1) of the Act, 21 U.S.C. section 360bbb-3(b)(1), unless the authorization is terminated or revoked sooner.  Performed at [facility], [place] [address] [place], [place] [address]    Time coordinating discharge: 35 minutes  SIGNED:  Kerney Elbe, DO [facility] 12/31/2020, 12:11 PM Pager is on [address]  If 7PM-7AM, please contact night-coverage [email or web address]

## 2020-12-31 NOTE — Plan of Care (Signed)

## 2021-01-12 ENCOUNTER — Ambulatory Visit: Payer: Federal, State, Local not specified - PPO | Attending: Critical Care Medicine

## 2021-01-12 DIAGNOSIS — Z23 Encounter for immunization: Secondary | ICD-10-CM

## 2021-01-12 NOTE — Progress Notes (Signed)
   Covid-[facility] Vaccination [facility]  Name:  Todd Cain    MRN: [id number] DOB: 1941-03-09  01/12/2021  [name] was observed post Covid-19 immunization for 15 min without incident. He was provided with Vaccine Information Sheet and instruction to access the V-Safe system.   [name] was instructed to call 911 with any severe reactions post vaccine: Marland Kitchen Difficulty breathing  . Swelling of face and throat  . A fast heartbeat  . A bad rash all over body  . Dizziness and weakness   Immunizations Administered    Name Date Dose VIS Date Route   Moderna COVID-19 Vaccine 01/12/2021  9:40 AM 0.5 mL 08/26/2020 Intramuscular   Manufacturer: Moderna   Lot: [id number]   [place]: 13143-888-75

## 2021-01-18 ENCOUNTER — Ambulatory Visit: Payer: Federal, State, Local not specified - PPO | Admitting: Podiatry

## 2021-02-18 ENCOUNTER — Other Ambulatory Visit: Payer: Self-pay | Admitting: Adult Health

## 2021-03-23 ENCOUNTER — Encounter: Payer: Self-pay | Admitting: Podiatry

## 2021-03-23 ENCOUNTER — Ambulatory Visit (INDEPENDENT_AMBULATORY_CARE_PROVIDER_SITE_OTHER): Payer: Federal, State, Local not specified - PPO | Admitting: Podiatry

## 2021-03-23 ENCOUNTER — Other Ambulatory Visit: Payer: Self-pay

## 2021-03-23 DIAGNOSIS — G300 Alzheimer's disease with early onset: Secondary | ICD-10-CM

## 2021-03-23 DIAGNOSIS — M79675 Pain in left toe(s): Secondary | ICD-10-CM

## 2021-03-23 DIAGNOSIS — M79674 Pain in right toe(s): Secondary | ICD-10-CM

## 2021-03-23 DIAGNOSIS — F0280 Dementia in other diseases classified elsewhere without behavioral disturbance: Secondary | ICD-10-CM | POA: Diagnosis not present

## 2021-03-23 DIAGNOSIS — B351 Tinea unguium: Secondary | ICD-10-CM | POA: Diagnosis not present

## 2021-03-23 NOTE — Progress Notes (Signed)
This patient returns to the office for evaluation and treatment of long thick painful nails .  This patient is unable to trim his own nails since the patient cannot reach his feet.  Patient says the nails are painful walking and wearing his shoes.He presents to the office with his wife. He returns for preventive foot care services.  General Appearance  Alert, conversant and in no acute stress.  Vascular  Dorsalis pedis and posterior tibial  pulses are not palpable  Bilaterally due to swelling..  Capillary return is within normal limits  bilaterally. Cold feet bilaterally.  Absent digital hair  Neurologic  Senn-Weinstein monofilament wire test deferred. Muscle power within normal limits bilaterally.  Nails Thick disfigured discolored nails with subungual debris  from hallux to fifth toes bilaterally. No evidence of bacterial infection or drainage bilaterally.  Orthopedic  No limitations of motion  feet .  No crepitus or effusions noted.  No bony pathology or digital deformities noted.  Skin  normotropic skin with no porokeratosis noted bilaterally.  No signs of infections or ulcers noted.     Onychomycosis  Pain in toes right foot  Pain in toes left foot  Debridement  of nails  1-5  B/L with a nail nipper.  Nails were then filed using a dremel tool with no incidents.    RTC prn   Gardiner Barefoot DPM

## 2021-04-14 ENCOUNTER — Ambulatory Visit: Payer: Federal, State, Local not specified - PPO | Admitting: Neurology

## 2021-04-14 ENCOUNTER — Encounter: Payer: Self-pay | Admitting: Neurology

## 2021-04-14 ENCOUNTER — Telehealth: Payer: Self-pay | Admitting: Neurology

## 2021-04-14 NOTE — Telephone Encounter (Signed)
This patient did not show for a revisit appointment today. 

## 2021-05-23 ENCOUNTER — Other Ambulatory Visit: Payer: Self-pay | Admitting: Adult Health

## 2021-05-24 NOTE — Telephone Encounter (Signed)
Pt needs to make appt, no showed last appt

## 2021-06-07 ENCOUNTER — Other Ambulatory Visit: Payer: Self-pay | Admitting: Neurology

## 2022-10-12 IMAGING — CT CT HEAD W/O CM
3 of 4 series · 15 of 47 positions shown, 18 images · non-contrast
Comparison: MRI 05/03/2017

CLINICAL DATA: Dizziness fall

EXAM:
CT HEAD WITHOUT CONTRAST
TECHNIQUE: Contiguous axial images were obtained from the base of the skull
through the vertex without intravenous contrast.

[Series 3: head wo · axial · 0.43mm/px · z∈[+1444,+1574]mm · 9 of 34 slices shown, 12 images]
[im 4/34  brain]
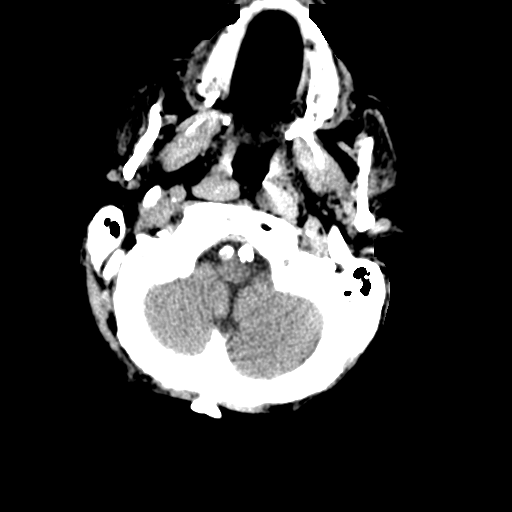
[im 4/34  bone]
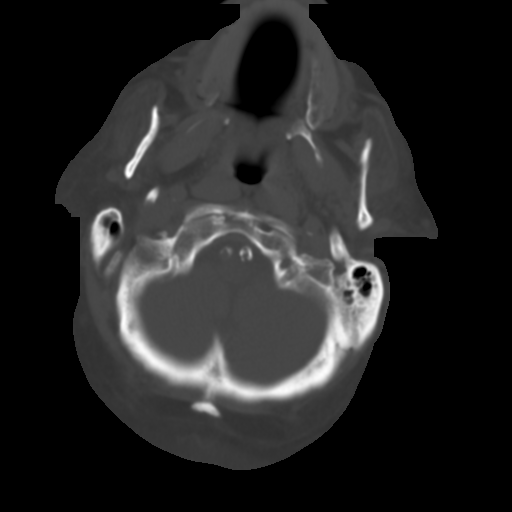
[im 7/34  brain]
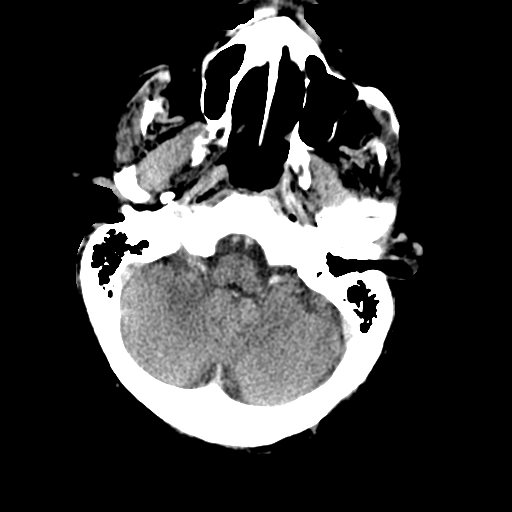
[im 10/34  brain]
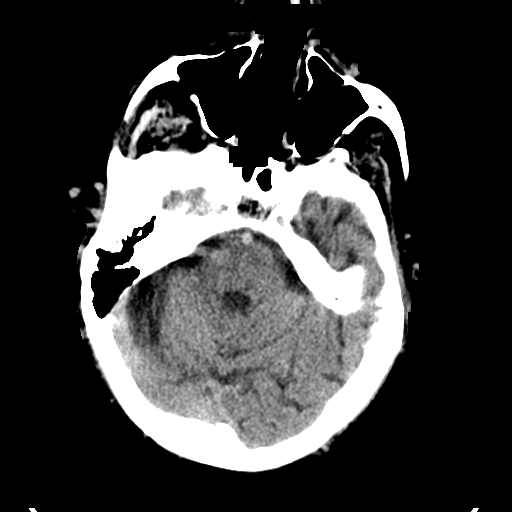
[im 14/34  brain]
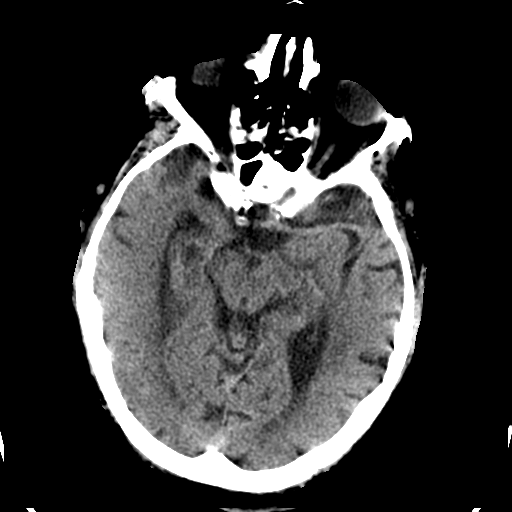
[im 17/34  brain]
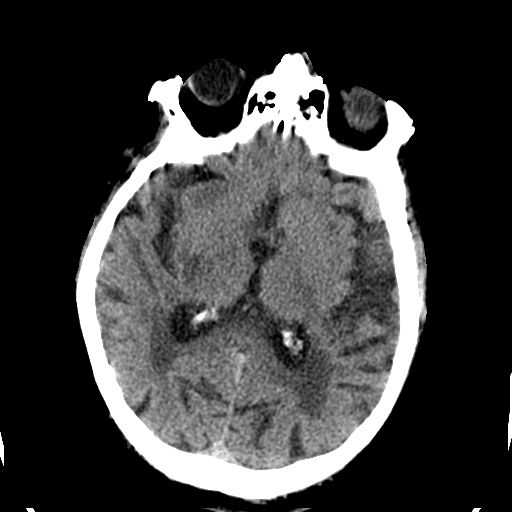
[im 17/34  bone]
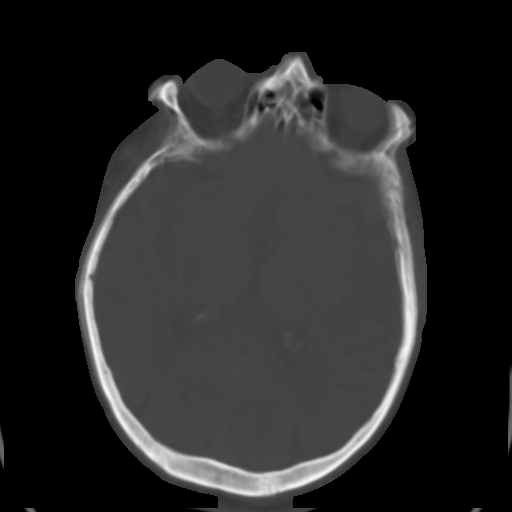
[im 20/34  brain]
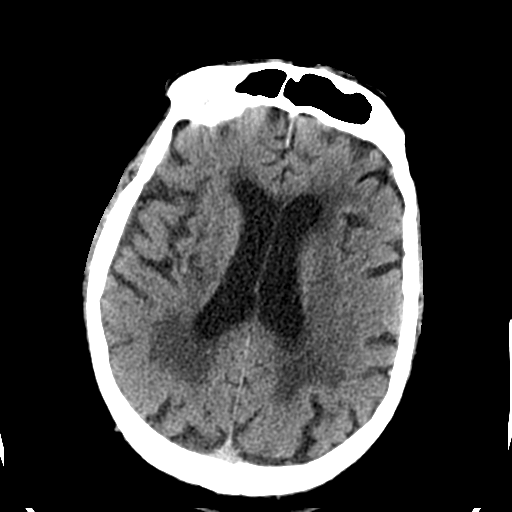
[im 24/34  brain]
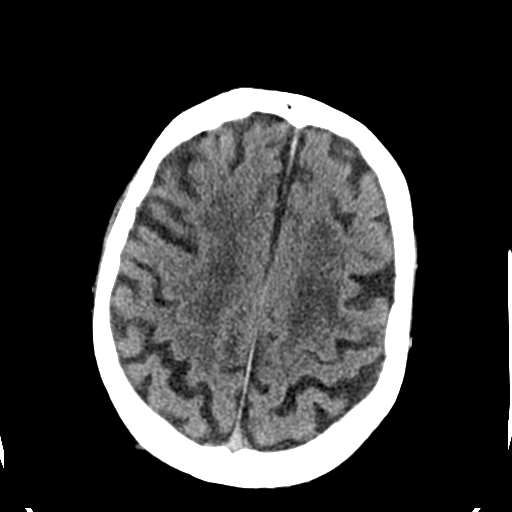
[im 27/34  brain]
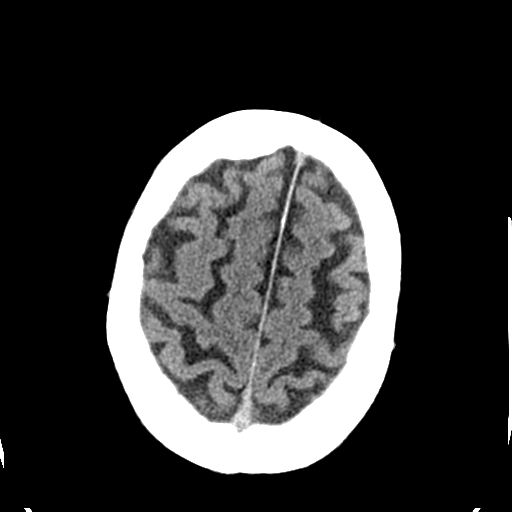
[im 30/34  brain]
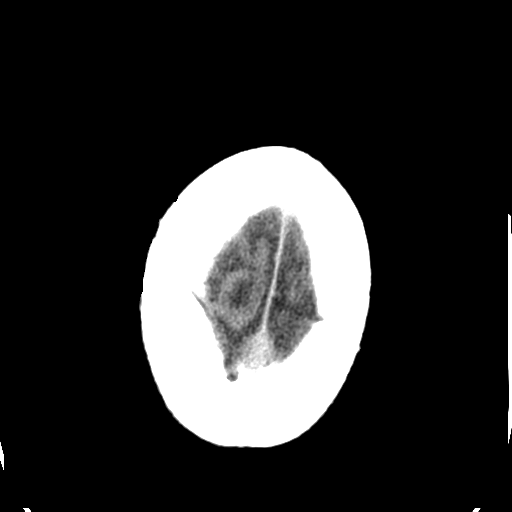
[im 30/34  bone]
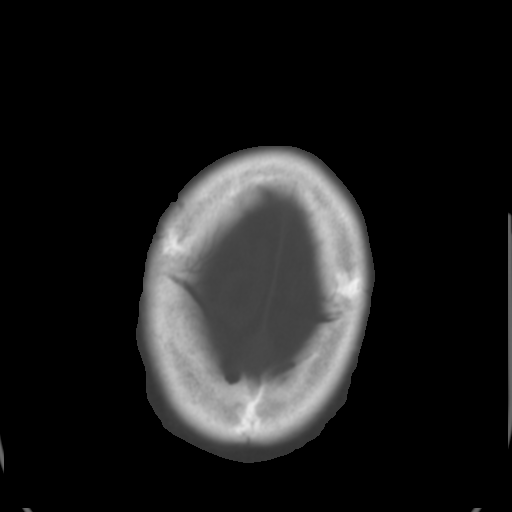

[Series 4: coronal soft tissue · coronal · 0.33mm/px · 3 of 72 slices shown]
[im 25/72  brain]
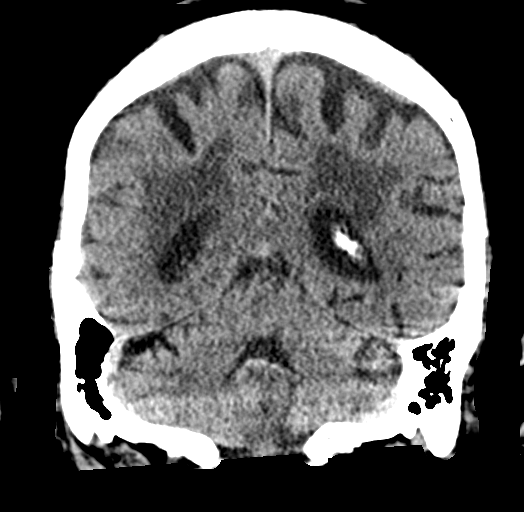
[im 32/72  brain]
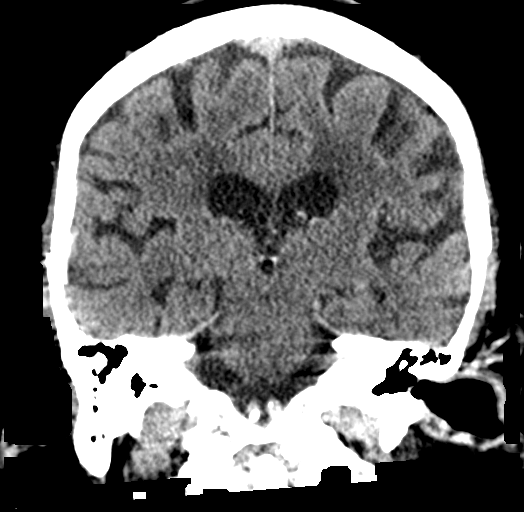
[im 40/72  brain]
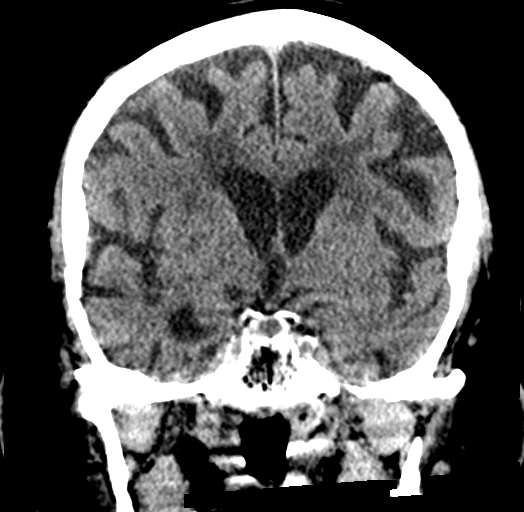

[Series 5: sagittal soft tissue · sagittal · 0.33mm/px · 3 of 55 slices shown]
[im 19/55  brain]
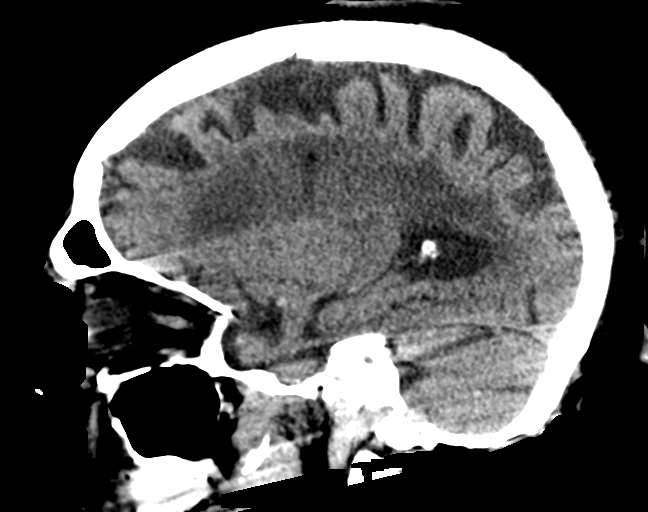
[im 28/55  brain]
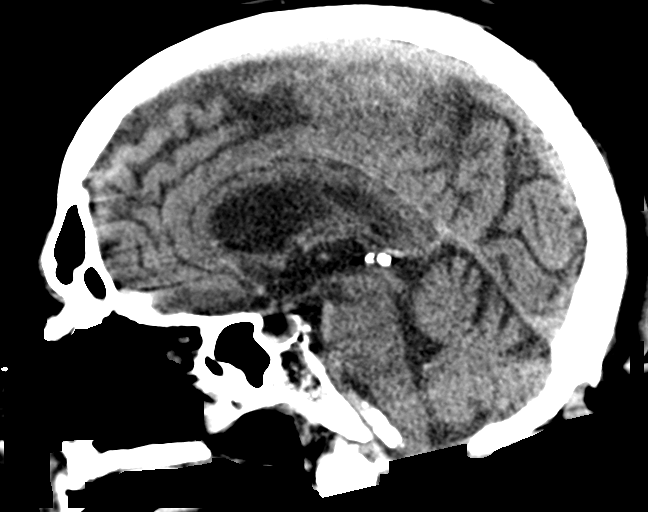
[im 37/55  brain]
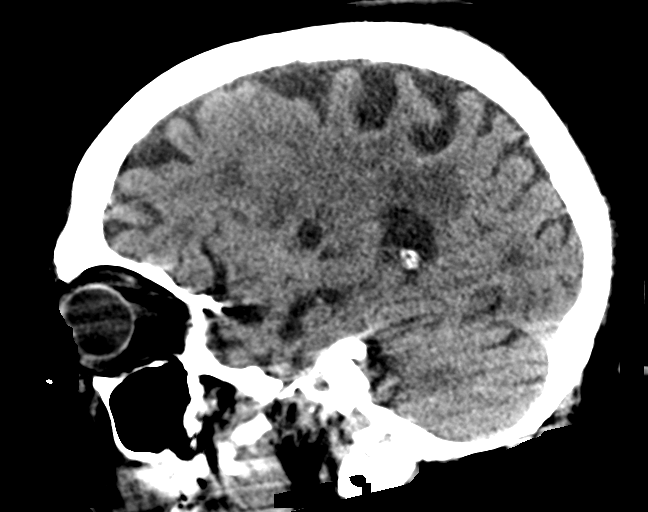

[15 of 47 positions shown; findings below may reference images not displayed]

FINDINGS: Brain: No acute territorial infarction, hemorrhage, or intracranial
mass. Advanced hypodensity in the white matter consistent with
chronic small vessel ischemic change. Advanced atrophy. Chronic
appearing right basal ganglial infarct. Nonenlarged ventricles.

Vascular: No hyperdense vessels. Vertebral and carotid vascular
calcification

Skull: Normal. Negative for fracture or focal lesion.

Sinuses/Orbits: Mucosal thickening in the sinuses

Other: None
IMPRESSION: 1. No CT evidence for acute intracranial abnormality.
2. Atrophy and chronic small vessel ischemic changes of the white
matter. Chronic appearing right basal ganglial infarct.
